# Patient Record
Sex: Female | Born: 1940 | Race: White | Hispanic: No | Marital: Married | Smoking: Former smoker
Health system: Southern US, Community
[De-identification: ages and names within clinical notes are randomized; demographics above are authoritative.]

## PROBLEM LIST (undated history)

## (undated) DIAGNOSIS — I8390 Asymptomatic varicose veins of unspecified lower extremity: Secondary | ICD-10-CM

## (undated) DIAGNOSIS — E039 Hypothyroidism, unspecified: Secondary | ICD-10-CM

## (undated) DIAGNOSIS — F419 Anxiety disorder, unspecified: Secondary | ICD-10-CM

## (undated) DIAGNOSIS — E079 Disorder of thyroid, unspecified: Secondary | ICD-10-CM

## (undated) DIAGNOSIS — I10 Essential (primary) hypertension: Secondary | ICD-10-CM

## (undated) DIAGNOSIS — Z8489 Family history of other specified conditions: Secondary | ICD-10-CM

## (undated) HISTORY — DX: Essential (primary) hypertension: I10

## (undated) HISTORY — DX: Asymptomatic varicose veins of unspecified lower extremity: I83.90

## (undated) HISTORY — DX: Disorder of thyroid, unspecified: E07.9

## (undated) HISTORY — PX: OTHER SURGICAL HISTORY: SHX169

---

## 1998-05-27 ENCOUNTER — Other Ambulatory Visit: Admission: RE | Admit: 1998-05-27 | Discharge: 1998-05-27 | Payer: Self-pay | Admitting: Family Medicine

## 1998-12-28 ENCOUNTER — Encounter: Admission: RE | Admit: 1998-12-28 | Discharge: 1998-12-28 | Payer: Self-pay | Admitting: Family Medicine

## 1998-12-28 ENCOUNTER — Encounter: Payer: Self-pay | Admitting: Family Medicine

## 2000-01-03 ENCOUNTER — Encounter: Admission: RE | Admit: 2000-01-03 | Discharge: 2000-01-03 | Payer: Self-pay | Admitting: Family Medicine

## 2000-01-03 ENCOUNTER — Encounter: Payer: Self-pay | Admitting: Family Medicine

## 2002-06-25 ENCOUNTER — Encounter: Payer: Self-pay | Admitting: Family Medicine

## 2002-06-25 ENCOUNTER — Encounter: Admission: RE | Admit: 2002-06-25 | Discharge: 2002-06-25 | Payer: Self-pay | Admitting: Family Medicine

## 2002-11-06 ENCOUNTER — Other Ambulatory Visit: Admission: RE | Admit: 2002-11-06 | Discharge: 2002-11-06 | Payer: Self-pay | Admitting: Family Medicine

## 2003-01-05 ENCOUNTER — Encounter: Admission: RE | Admit: 2003-01-05 | Discharge: 2003-01-05 | Payer: Self-pay | Admitting: Family Medicine

## 2004-05-04 ENCOUNTER — Ambulatory Visit: Payer: Self-pay | Admitting: Family Medicine

## 2004-06-20 ENCOUNTER — Ambulatory Visit: Payer: Self-pay | Admitting: Family Medicine

## 2004-06-27 ENCOUNTER — Ambulatory Visit: Payer: Self-pay | Admitting: Family Medicine

## 2004-06-27 ENCOUNTER — Other Ambulatory Visit: Admission: RE | Admit: 2004-06-27 | Discharge: 2004-06-27 | Payer: Self-pay | Admitting: Family Medicine

## 2004-12-28 ENCOUNTER — Ambulatory Visit: Payer: Self-pay | Admitting: Family Medicine

## 2005-01-04 ENCOUNTER — Ambulatory Visit (HOSPITAL_COMMUNITY): Admission: RE | Admit: 2005-01-04 | Discharge: 2005-01-04 | Payer: Self-pay | Admitting: Family Medicine

## 2005-04-05 ENCOUNTER — Ambulatory Visit: Payer: Self-pay | Admitting: Family Medicine

## 2005-06-18 ENCOUNTER — Encounter: Admission: RE | Admit: 2005-06-18 | Discharge: 2005-06-18 | Payer: Self-pay | Admitting: Family Medicine

## 2005-06-18 ENCOUNTER — Ambulatory Visit: Payer: Self-pay | Admitting: Family Medicine

## 2005-06-27 ENCOUNTER — Ambulatory Visit: Payer: Self-pay | Admitting: Family Medicine

## 2005-06-27 ENCOUNTER — Other Ambulatory Visit: Admission: RE | Admit: 2005-06-27 | Discharge: 2005-06-27 | Payer: Self-pay | Admitting: Family Medicine

## 2005-06-27 ENCOUNTER — Encounter: Admission: RE | Admit: 2005-06-27 | Discharge: 2005-06-27 | Payer: Self-pay | Admitting: Family Medicine

## 2005-06-28 ENCOUNTER — Ambulatory Visit: Payer: Self-pay | Admitting: Family Medicine

## 2005-07-03 ENCOUNTER — Ambulatory Visit: Payer: Self-pay | Admitting: Family Medicine

## 2005-07-05 ENCOUNTER — Ambulatory Visit: Payer: Self-pay | Admitting: Family Medicine

## 2005-07-23 ENCOUNTER — Ambulatory Visit: Payer: Self-pay | Admitting: Family Medicine

## 2005-07-24 ENCOUNTER — Encounter: Admission: RE | Admit: 2005-07-24 | Discharge: 2005-07-24 | Payer: Self-pay | Admitting: Family Medicine

## 2005-08-06 ENCOUNTER — Encounter: Admission: RE | Admit: 2005-08-06 | Discharge: 2005-08-06 | Payer: Self-pay | Admitting: Orthopaedic Surgery

## 2006-05-14 ENCOUNTER — Encounter: Admission: RE | Admit: 2006-05-14 | Discharge: 2006-05-14 | Payer: Self-pay | Admitting: Internal Medicine

## 2007-10-13 ENCOUNTER — Encounter: Admission: RE | Admit: 2007-10-13 | Discharge: 2007-10-13 | Payer: Self-pay | Admitting: Internal Medicine

## 2008-02-13 ENCOUNTER — Encounter: Admission: RE | Admit: 2008-02-13 | Discharge: 2008-02-13 | Payer: Self-pay | Admitting: Internal Medicine

## 2008-11-16 ENCOUNTER — Encounter: Admission: RE | Admit: 2008-11-16 | Discharge: 2008-11-16 | Payer: Self-pay | Admitting: Internal Medicine

## 2010-05-22 ENCOUNTER — Telehealth: Payer: Self-pay | Admitting: Family Medicine

## 2010-05-22 ENCOUNTER — Other Ambulatory Visit: Payer: Self-pay | Admitting: Internal Medicine

## 2010-05-22 DIAGNOSIS — Z1231 Encounter for screening mammogram for malignant neoplasm of breast: Secondary | ICD-10-CM

## 2010-05-22 NOTE — Telephone Encounter (Signed)
patient  Will call [facility]

## 2010-05-22 NOTE — Telephone Encounter (Signed)
Pt called and needed to know that date,doctor, location, of last colonoscopy. Pls advise.

## 2010-05-24 ENCOUNTER — Ambulatory Visit
Admission: RE | Admit: 2010-05-24 | Discharge: 2010-05-24 | Disposition: A | Discharge status: Home / Self Care | Payer: Medicare Other | Source: Ambulatory Visit | Attending: Internal Medicine | Admitting: Internal Medicine

## 2010-05-24 DIAGNOSIS — Z1231 Encounter for screening mammogram for malignant neoplasm of breast: Secondary | ICD-10-CM

## 2011-02-20 ENCOUNTER — Other Ambulatory Visit: Payer: Self-pay | Admitting: Dermatology

## 2011-05-29 ENCOUNTER — Other Ambulatory Visit: Payer: Self-pay | Admitting: Internal Medicine

## 2011-05-29 DIAGNOSIS — Z1231 Encounter for screening mammogram for malignant neoplasm of breast: Secondary | ICD-10-CM

## 2011-06-05 ENCOUNTER — Ambulatory Visit
Admission: RE | Admit: 2011-06-05 | Discharge: 2011-06-05 | Disposition: A | Discharge status: Home / Self Care | Payer: PRIVATE HEALTH INSURANCE | Source: Ambulatory Visit | Attending: Internal Medicine | Admitting: Internal Medicine

## 2011-06-05 DIAGNOSIS — Z1231 Encounter for screening mammogram for malignant neoplasm of breast: Secondary | ICD-10-CM

## 2011-08-03 ENCOUNTER — Encounter: Payer: Self-pay | Admitting: Vascular Surgery

## 2011-08-06 ENCOUNTER — Ambulatory Visit (INDEPENDENT_AMBULATORY_CARE_PROVIDER_SITE_OTHER): Payer: Medicare Other | Admitting: Vascular Surgery

## 2011-08-06 ENCOUNTER — Encounter: Payer: Self-pay | Admitting: Vascular Surgery

## 2011-08-06 ENCOUNTER — Ambulatory Visit (INDEPENDENT_AMBULATORY_CARE_PROVIDER_SITE_OTHER): Payer: Medicare Other | Admitting: *Deleted

## 2011-08-06 VITALS — BP 132/80 | HR 73 | Resp 16 | Ht 67.0 in | Wt 208.0 lb

## 2011-08-06 DIAGNOSIS — R609 Edema, unspecified: Secondary | ICD-10-CM

## 2011-08-06 DIAGNOSIS — I83893 Varicose veins of bilateral lower extremities with other complications: Secondary | ICD-10-CM

## 2011-08-06 NOTE — Progress Notes (Signed)
Subjective:     Patient ID: Terri Vasquez, female   DOB: 12/18/1940, 71 y.o.   MRN: [id number]  HPI this 71 year old female was referred for spider veins in both lower extremities. She denies any history of DVT, thrombophlebitis, stasis ulcers, bleeding, or distal edema. She has no pain in her legs. She has had sclerotherapy in the past at [facility]  dermatology a few years ago with good results. It is not wear elastic compression stockings. Her right leg is more involved than the left.  Past Medical History  Diagnosis Date  . Varicose veins     History  Substance Use Topics  . Smoking status: Former Games developer  . Smokeless tobacco: Not on file  . Alcohol Use: No    Family History  Problem Relation Age of Onset  . Cancer Mother   . Heart disease Mother   . Hyperlipidemia Mother   . Hypertension Mother     Allergies no known allergies  Current outpatient prescriptions:alendronate (FOSAMAX) 70 MG tablet, , Disp: , Rfl: ;  aspirin 81 MG tablet, Take 81 mg by mouth daily., Disp: , Rfl: ;  atorvastatin (LIPITOR) 10 MG tablet, , Disp: , Rfl: ;  fluocinonide cream (LIDEX) 0.05 %, , Disp: , Rfl: ;  hydrochlorothiazide (HYDRODIURIL) 25 MG tablet, , Disp: , Rfl: ;  levothyroxine (SYNTHROID, LEVOTHROID) 125 MCG tablet, , Disp: , Rfl:   BP 132/80  Pulse 73  Resp 16  Ht 5\' 7"  (1.702 m)  Wt 208 lb (94.348 kg)  BMI 32.58 kg/m2  Body mass index is 32.58 kg/(m^2).        Review of Systems history of right Achilles tendon repair, denies chest pain, dyspnea on exertion, PND, orthopnea, claudication or other systems are negative for complete review of systems    Objective:   Physical Exam blood pressure 1:30 rate heart rate 73 respirations 16 Gen.-alert and oriented x3 in no apparent distress HEENT normal for age Lungs no rhonchi or wheezing Cardiovascular regular rhythm no murmurs carotid pulses 3+ palpable no bruits audible Abdomen soft nontender no palpable masses Musculoskeletal free  of  major deformities Skin clear -no rashes Neurologic normal Lower extremities 3+ femoral and dorsalis pedis pulses palpable bilaterally with no edema she has diffuse spider veins in both lower extremities and anterior and medial thighs in the medial calf and pretibial regions. There is no distal edema and no hyperpigmentation or ulceration is noted. No bulging varicosities are present.  Today I ordered bilateral venous duplex exam which I reviewed and interpreted. She has no DVT. She has mild reflux in the deep system. There is some reflux in the proximal great saphenous vein in the saphenofemoral junction but the veins rapidly exited the fascia.       Assessment:     Bilateral diffuse spider veins-asymptomatic No evidence of severe venous insufficiency    Plan:     Discuss possible sclerotherapy with patient and she will consider this as an option

## 2011-08-09 NOTE — Procedures (Unsigned)
LOWER EXTREMITY VENOUS REFLUX EXAM  INDICATION:  Edema  EXAM:  Using color-flow imaging and pulse Doppler spectral analysis, the bilateral common femoral, femoral, popliteal, posterior tibial, great and small saphenous veins were evaluated.  There is evidence suggesting deep venous insufficiency in the bilateral lower extremity left greater than right.  The bilateral saphenofemoral junction is not competent with reflux of >500 milliseconds.  The bilateral GSV is not competent with reflux of >500 milliseconds with the caliber as described below, however greater saphenous exits fascia in the mid thigh area bilaterally.  The bilateral small saphenous veins demonstrate competency.  GSV Diameter (used if found to be incompetent only)                                           Right    Left Proximal Greater Saphenous Vein           0.39 cm  0.43 Proximal-to-mid-thigh                     0.39 cm  0.39 (out of fascia) Mid thigh                                 0.39 (out of fascia)    0.53 (out of fascia) Mid-distal thigh Distal thigh Knee  IMPRESSION: 1. The bilateral greater saphenous vein is not competent with reflux     of >500 milliseconds. 2. The bilateral greater saphenous vein is not tortuous however leaves     the fascia in the proximal to mid portion of the thigh. 3. The deep venous system is not competent with reflux of >500     milliseconds left greater than right. 4. The bilateral small saphenous vein is competent. 5. No evidence of acute deep venous thrombosis bilaterally.      ___________________________________________ Quita Skye Hart [place], M.D.  SS/MEDQ  D:  08/06/2011  T:  08/06/2011  Job:  [id number]

## 2011-08-14 ENCOUNTER — Encounter: Payer: PRIVATE HEALTH INSURANCE | Admitting: Vascular Surgery

## 2012-09-25 ENCOUNTER — Ambulatory Visit
Admission: RE | Admit: 2012-09-25 | Discharge: 2012-09-25 | Disposition: A | Discharge status: Home / Self Care | Payer: Medicare Other | Source: Ambulatory Visit | Attending: Internal Medicine | Admitting: Internal Medicine

## 2012-09-25 ENCOUNTER — Other Ambulatory Visit: Payer: Self-pay | Admitting: Internal Medicine

## 2012-09-25 DIAGNOSIS — R51 Headache: Secondary | ICD-10-CM

## 2013-04-14 ENCOUNTER — Other Ambulatory Visit: Payer: Self-pay | Admitting: Internal Medicine

## 2013-04-14 ENCOUNTER — Ambulatory Visit
Admission: RE | Admit: 2013-04-14 | Discharge: 2013-04-14 | Disposition: A | Discharge status: Home / Self Care | Payer: Medicare Other | Source: Ambulatory Visit | Attending: Internal Medicine | Admitting: Internal Medicine

## 2013-04-14 DIAGNOSIS — M25539 Pain in unspecified wrist: Secondary | ICD-10-CM

## 2013-06-16 ENCOUNTER — Encounter: Payer: Self-pay | Admitting: Gastroenterology

## 2013-06-16 ENCOUNTER — Other Ambulatory Visit: Payer: Self-pay | Admitting: Internal Medicine

## 2013-06-16 DIAGNOSIS — Z1231 Encounter for screening mammogram for malignant neoplasm of breast: Secondary | ICD-10-CM

## 2013-06-22 ENCOUNTER — Ambulatory Visit
Admission: RE | Admit: 2013-06-22 | Discharge: 2013-06-22 | Disposition: A | Discharge status: Home / Self Care | Payer: Medicare Other | Source: Ambulatory Visit | Attending: Internal Medicine | Admitting: Internal Medicine

## 2013-06-22 DIAGNOSIS — Z1231 Encounter for screening mammogram for malignant neoplasm of breast: Secondary | ICD-10-CM

## 2013-07-14 ENCOUNTER — Ambulatory Visit (AMBULATORY_SURGERY_CENTER): Payer: Self-pay

## 2013-07-14 VITALS — Ht 67.0 in | Wt 204.6 lb

## 2013-07-14 DIAGNOSIS — Z1211 Encounter for screening for malignant neoplasm of colon: Secondary | ICD-10-CM

## 2013-07-14 MED ORDER — NA SULFATE-K SULFATE-MG SULF 17.5-3.13-1.6 GM/177ML PO SOLN: ORAL | Status: DC

## 2013-07-14 NOTE — Progress Notes (Signed)
Per pt, no allergies to soy or egg products.Pt not taking any weight loss meds or using  O2 at home. 

## 2013-07-21 ENCOUNTER — Encounter: Payer: Self-pay | Admitting: Gastroenterology

## 2013-07-28 ENCOUNTER — Encounter: Payer: Medicare Other | Admitting: Gastroenterology

## 2013-09-22 ENCOUNTER — Telehealth: Payer: Self-pay | Admitting: Gastroenterology

## 2013-09-22 ENCOUNTER — Ambulatory Visit: Payer: Medicare Other | Admitting: Gastroenterology

## 2013-09-22 NOTE — Progress Notes (Signed)
Pt arrived for colonoscopy at 715 and states that she had not drank her 2nd half of prep.  She over slept because "her husband has the alarm and did not wake her". Per Dr. Arlyce DiceKaplan, I spoke with Mrs. Terri Vasquez and advised her that he would like for her to reschedule.  She does not want to reschedule at this time.  She will call back when she is ready.

## 2014-02-09 ENCOUNTER — Other Ambulatory Visit: Payer: Self-pay | Admitting: Dermatology

## 2015-07-14 ENCOUNTER — Encounter: Payer: Self-pay | Admitting: Gastroenterology

## 2015-09-15 ENCOUNTER — Encounter: Payer: Self-pay | Admitting: Gastroenterology

## 2015-10-06 ENCOUNTER — Encounter: Payer: Self-pay | Admitting: Internal Medicine

## 2016-04-10 ENCOUNTER — Other Ambulatory Visit: Payer: Self-pay | Admitting: Internal Medicine

## 2016-04-10 ENCOUNTER — Ambulatory Visit
Admission: RE | Admit: 2016-04-10 | Discharge: 2016-04-10 | Disposition: A | Discharge status: Home / Self Care | Payer: Medicare Other | Source: Ambulatory Visit | Attending: Internal Medicine | Admitting: Internal Medicine

## 2016-04-10 DIAGNOSIS — M79601 Pain in right arm: Secondary | ICD-10-CM

## 2016-09-22 ENCOUNTER — Emergency Department (HOSPITAL_COMMUNITY)
Admission: EM | Admit: 2016-09-22 | Discharge: 2016-09-22 | Disposition: A | Discharge status: Home / Self Care | Payer: Medicare Other | Attending: Emergency Medicine | Admitting: Emergency Medicine

## 2016-09-22 ENCOUNTER — Encounter (HOSPITAL_COMMUNITY): Payer: Self-pay | Admitting: *Deleted

## 2016-09-22 ENCOUNTER — Emergency Department (HOSPITAL_COMMUNITY): Payer: Medicare Other

## 2016-09-22 DIAGNOSIS — R4689 Other symptoms and signs involving appearance and behavior: Secondary | ICD-10-CM | POA: Insufficient documentation

## 2016-09-22 DIAGNOSIS — Z79899 Other long term (current) drug therapy: Secondary | ICD-10-CM | POA: Insufficient documentation

## 2016-09-22 DIAGNOSIS — I10 Essential (primary) hypertension: Secondary | ICD-10-CM | POA: Diagnosis not present

## 2016-09-22 DIAGNOSIS — R413 Other amnesia: Secondary | ICD-10-CM | POA: Diagnosis present

## 2016-09-22 DIAGNOSIS — Z7982 Long term (current) use of aspirin: Secondary | ICD-10-CM | POA: Insufficient documentation

## 2016-09-22 DIAGNOSIS — Z87891 Personal history of nicotine dependence: Secondary | ICD-10-CM | POA: Diagnosis not present

## 2016-09-22 LAB — URINALYSIS, ROUTINE W REFLEX MICROSCOPIC
BACTERIA UA: NONE SEEN
Bilirubin Urine: NEGATIVE
Glucose, UA: NEGATIVE mg/dL
Hgb urine dipstick: NEGATIVE
KETONES UR: NEGATIVE mg/dL
Nitrite: NEGATIVE
PROTEIN: NEGATIVE mg/dL
SQUAMOUS EPITHELIAL / LPF: NONE SEEN
Specific Gravity, Urine: 1.003 — ABNORMAL LOW (ref 1.005–1.030)
pH: 7 (ref 5.0–8.0)

## 2016-09-22 LAB — CBC
HEMATOCRIT: 33.7 % — AB (ref 36.0–46.0)
Hemoglobin: 11.1 g/dL — ABNORMAL LOW (ref 12.0–15.0)
MCH: 28.7 pg (ref 26.0–34.0)
MCHC: 32.9 g/dL (ref 30.0–36.0)
MCV: 87.1 fL (ref 78.0–100.0)
PLATELETS: 334 10*3/uL (ref 150–400)
RBC: 3.87 MIL/uL (ref 3.87–5.11)
RDW: 13.5 % (ref 11.5–15.5)
WBC: 5.1 10*3/uL (ref 4.0–10.5)

## 2016-09-22 LAB — COMPREHENSIVE METABOLIC PANEL
ALT: 12 U/L — AB (ref 14–54)
AST: 18 U/L (ref 15–41)
Albumin: 3.5 g/dL (ref 3.5–5.0)
Alkaline Phosphatase: 69 U/L (ref 38–126)
Anion gap: 10 (ref 5–15)
BUN: 8 mg/dL (ref 6–20)
CHLORIDE: 101 mmol/L (ref 101–111)
CO2: 22 mmol/L (ref 22–32)
CREATININE: 1 mg/dL (ref 0.44–1.00)
Calcium: 8.8 mg/dL — ABNORMAL LOW (ref 8.9–10.3)
GFR calc non Af Amer: 54 mL/min — ABNORMAL LOW (ref >60)
Glucose, Bld: 97 mg/dL (ref 65–99)
Potassium: 3.9 mmol/L (ref 3.5–5.1)
SODIUM: 133 mmol/L — AB (ref 135–145)
Total Bilirubin: 1.1 mg/dL (ref 0.3–1.2)
Total Protein: 7 g/dL (ref 6.5–8.1)

## 2016-09-22 NOTE — Discharge Instructions (Signed)
Follow-up with your primary doctor and social work on Monday.

## 2016-09-22 NOTE — ED Triage Notes (Signed)
The pt is here with her huisband  Both are experiencing memory loss.  The son with her   Reports that  His father he thinks has dementia but has not been diagnosed.  They have noticed his mothers memory slippiong a little.  Today the bank called him telling him that his mother had been there requesting to withdraw all her money from them..  The pt does not remember the incident  Alert  She does not know the day of the week she does not know the month the year or who the president is  Equal grips no gait disturbances  No facial droop no drifts

## 2016-09-22 NOTE — ED Provider Notes (Signed)
[facility]-EMERGENCY DEPT Provider Note   CSN: [id number] Arrival date & time: 09/22/16  1804     History   Chief Complaint Chief Complaint  Patient presents with  . Memory Loss    HPI URIEL HORKEY is a 76 y.o. female.  Patient presents with son for workup of memory loss. They've noticed worsening over the past few weeks. Their father they think has dementia as he has had memory loss symptoms for months. She currently lives with her husband in the same house they've been in for years. Today the bank called as she tried to withdrawal all her money. No concerning headaches or other neurologic symptoms. No fevers or chills. No urinary symptoms. Husband is also in the ER getting assessed. No concern for gas leak or other chemicals or new medications with either husband or wife.      Past Medical History:  Diagnosis Date  . Hypertension   . Thyroid disease   . Varicose veins     Patient Active Problem List   Diagnosis Date Noted  . 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 foot surgeries     right foot on achilles tendon    OB History    No data available       Home Medications    Prior to Admission medications   Medication Sig Start Date End Date Taking? Authorizing Provider  acetaminophen (TYLENOL) 500 MG tablet Take 500 mg by mouth every 6 (six) hours as needed for headache (pain).   Yes [provider]  aspirin EC 81 MG tablet Take 81 mg by mouth daily.   Yes [provider]  atorvastatin (LIPITOR) 10 MG tablet Take 10 mg by mouth daily.  05/20/11  Yes [provider]  levothyroxine (SYNTHROID, LEVOTHROID) 125 MCG tablet Take 125 mcg by mouth daily before breakfast.  05/16/11  Yes [provider]    Family History Family History  Problem Relation Age of Onset  . Cancer Mother   . Heart disease Mother   . Hyperlipidemia Mother   . Hypertension  Mother     Social History Social History  Substance Use Topics  . Smoking status: Former Smoker    Types: Cigarettes    Quit date: 07/14/1996  . Smokeless tobacco: Never Used  . Alcohol use No     Allergies   Patient has no known allergies.   Review of Systems Review of Systems  Constitutional: Negative for chills and fever.  HENT: Negative for congestion.   Eyes: Negative for visual disturbance.  Respiratory: Negative for shortness of breath.   Cardiovascular: Negative for chest pain.  Gastrointestinal: Negative for abdominal pain and vomiting.  Genitourinary: Negative for dysuria and flank pain.  Musculoskeletal: Negative for back pain, neck pain and neck stiffness.  Skin: Negative for rash.  Neurological: Negative for light-headedness and headaches.     Physical Exam Updated Vital Signs BP (!) 145/81   Pulse (!) 52   Temp 98 F (36.7 C) (Oral)   Resp 15   Ht 5\' 5"  (1.651 m)   Wt 64.4 kg (142 lb)   SpO2 97%   BMI 23.63 kg/m   Physical Exam  Constitutional: She is oriented to person, place, and time. She appears well-developed and well-nourished.  HENT:  Head: Normocephalic and atraumatic.  Eyes: Conjunctivae are normal. Right eye exhibits no discharge. Left eye exhibits no discharge.  Neck: Normal range  of motion. Neck supple. No tracheal deviation present.  Cardiovascular: Normal rate and regular rhythm.   Pulmonary/Chest: Effort normal and breath sounds normal.  Abdominal: Soft. She exhibits no distension. There is no tenderness. There is no guarding.  Musculoskeletal: She exhibits no edema.  Neurological: She is alert and oriented to person, place, and time.  Patient alert and answers most questions. Patient does avoid detailed questions. Patient does not know month or date. Patient does know her house, family. Patient has no focal neuro deficits. Patient equal strength upper lower extremities with finger nose intact. X Acoma muscle function intact pupils  equal. Neck supple. Memory difficulty.  Skin: Skin is warm. No rash noted.  Psychiatric: She has a normal mood and affect.  Nursing note and vitals reviewed.    ED Treatments / Results  Labs (all labs ordered are listed, but only abnormal results are displayed) Labs Reviewed  COMPREHENSIVE METABOLIC PANEL - Abnormal; Notable for the following:       Result Value   Sodium 133 (*)    Calcium 8.8 (*)    ALT 12 (*)    GFR calc non Af Amer 54 (*)    All other components within normal limits  CBC - Abnormal; Notable for the following:    Hemoglobin 11.1 (*)    HCT 33.7 (*)    All other components within normal limits  URINALYSIS, ROUTINE W REFLEX MICROSCOPIC - Abnormal; Notable for the following:    Color, Urine STRAW (*)    Specific Gravity, Urine 1.003 (*)    Leukocytes, UA MODERATE (*)    All other components within normal limits    EKG  EKG Interpretation None       Radiology Ct Head Wo Contrast  Result Date: 09/22/2016 CLINICAL DATA:  Memory loss. EXAM: CT HEAD WITHOUT CONTRAST TECHNIQUE: Contiguous axial images were obtained from the base of the skull through the vertex without intravenous contrast. COMPARISON:  09/25/2012 FINDINGS: Brain: There is no evidence for acute hemorrhage, hydrocephalus, mass lesion, or abnormal extra-axial fluid collection. No definite CT evidence for acute infarction. Diffuse loss of parenchymal volume is consistent with atrophy. Patchy low attenuation in the deep hemispheric and periventricular white matter is nonspecific, but likely reflects chronic microvascular ischemic demyelination. Mild prominence of the lateral ventricles likely related to central atrophy but a degree of hydrocephalus cannot be completely excluded. Vascular: No hyperdense vessel or unexpected calcification. Skull: No evidence for fracture. No worrisome lytic or sclerotic lesion. Sinuses/Orbits: The visualized paranasal sinuses and mastoid air cells are clear. Visualized  portions of the globes and intraorbital fat are unremarkable. Other: None. IMPRESSION: 1. No evidence for acute hemorrhage or midline shift. No findings for acute infarct. Ventricular prominence is slightly out of proportion to the degree of parenchymal volume loss and may be related to central atrophy although a component of underlying hydrocephalus cannot be excluded by CT. 2. Atrophy with chronic small vessel white matter ischemic disease. Electronically Signed   By: Kennith CenterEric  Mansell M.D.   On: 09/22/2016 20:49    Procedures Procedures (including critical care time)  Medications Ordered in ED Medications - No data to display   Initial Impression / Assessment and Plan / ED Course  I have reviewed the triage vital signs and the nursing notes.  Pertinent labs & imaging results that were available during my care of the patient were reviewed by me and considered in my medical decision making (see chart for details).     Patient presents  with worsening memory loss and change in behavior with trying to take out all her money from her bank. Patient's sons are with her in the ER. Plan for social work consult after blood work and CT scan of the head with urinalysis. Discussed it'll have to follow close with primary care and social worker on Monday.  Results and differential diagnosis were discussed with the patient/parent/guardian. Xrays were independently reviewed by myself.  Close follow up outpatient was discussed, comfortable with the plan.   Medications - No data to display  Vitals:   09/22/16 2036 09/22/16 2100 09/22/16 2115 09/22/16 2130  BP: (!) 162/81 (!) 179/74  (!) 145/81  Pulse: (!) 59 (!) 55 60 (!) 52  Resp: 15 16 16 15   Temp:      TempSrc:      SpO2: 98% 98% 96% 97%  Weight:      Height:        Final diagnoses:  Memory loss     Final Clinical Impressions(s) / ED Diagnoses   Final diagnoses:  Memory loss    New Prescriptions New Prescriptions   No medications on  file     Blane Ohara, MD 09/22/16 2154

## 2016-09-22 NOTE — ED Notes (Addendum)
Pt arrives with sonafter son was told she had attempted to take all the money nfrom her bank account. Pt denies pain or complaint. Pt is not oriented to place person or situation.

## 2016-09-22 NOTE — ED Notes (Signed)
Patient transported to CT 

## 2016-09-22 NOTE — ED Triage Notes (Signed)
They have c02 detectors that are checked frequently

## 2016-09-26 ENCOUNTER — Ambulatory Visit (INDEPENDENT_AMBULATORY_CARE_PROVIDER_SITE_OTHER): Payer: Medicare Other | Admitting: Neurology

## 2016-09-26 ENCOUNTER — Encounter: Payer: Self-pay | Admitting: Neurology

## 2016-09-26 ENCOUNTER — Other Ambulatory Visit: Payer: Medicare Other

## 2016-09-26 VITALS — BP 140/90 | HR 79 | Ht 67.5 in | Wt 172.3 lb

## 2016-09-26 DIAGNOSIS — R413 Other amnesia: Secondary | ICD-10-CM

## 2016-09-26 NOTE — Progress Notes (Signed)
NEUROLOGY CONSULTATION NOTE  Terri Burorma B Philipp MRN: [id number] DOB: 02-27-1941  Referring provider: Dr. Renford Dillsonald Polite Primary care provider: Dr. Renford Dillsonald Polite  Reason for consult:  Memory loss  Dear Dr Nehemiah SettlePolite:  Thank you for your kind referral of Terri Vasquez for consultation of the above symptoms. Although her history is well known to you, please allow me to reiterate it for the purpose of our medical record. The patient was accompanied to the clinic by her [name], who also provides collateral information. Records and images were personally reviewed where available.  HISTORY OF PRESENT ILLNESS: This is a pleasant 76 year old right-handed woman with a history of hyperlipidemia, hypothyroidism, diet-controlled hypertension, presenting for evaluation of memory. She thinks her memory is pretty good. She feels she has not had any problems. She lives with her husband who is in charge of finances, she feels she is pretty good with keeping up with her medications but states it is not perfect. She denies getting lost driving, no word-finding difficulties. Her son became concerned after an incident last 09/22/16 where family was called by the bank stating Ms. Terri Vasquez had come in wanting to withdraw all the money in her account. They could not get in touch with her, she had forgotten the cell phone her sons had gotten for her. A Silver Alert was issued, then she showed up at home the same time the police came. She was brought to [facility] ER where BP was noted to be elevated, CBC and CMP were unremarkable. She had a urinalysis showing moderate leukocytes, 6-30 WBC, negative nitrite. I personally reviewed head CT without contrast which did not show any acute changes, there was diffuse volume loss and chronic microvascular disease. There was note of mild prominence of lateral ventricles likely related to central atrophy but a degree of hydrocephalus could not be completely excluded. She saw Dr. Nehemiah SettlePolite a few days later,  TSH normal, B12 low normal 274 (ref [phone]). Her son today states that he is unsure if there was more of a misunderstanding at the bank, she states that she asked for $50, he thinks that because she has some word-finding issues, she may have said things in a way that sounded like she wanted all her money. Family started noticing memory changes over the past year. She would not recall a phone conversation they had. They would schedule get togethers but not show up, or they would not be home when family picked them up. Yesterday he called to remind her of today's appointment, after she got off the phone, her other son asked who called and she said she was not talking to anyone. It appears her husband is also having memory issues and her son reports that she has been dealing with him. She used to be great with sending birthday cards, but has slacked off the past 1-2 years. She has a calendar, and would forget an appointment if not on the calendar. He reports it is also "50/50" that she would remember an event if written on the calendar. She has missed 2 appointment with Dr. Nehemiah SettlePolite that her son found out about recently. Her husband generally drives and they have gotten lost, but her son is unsure who is driving those times. He did not know what medications she was taking, but the pharmacist told her son that she was not taking them every time. He denies any personality changes, hallucinations, no paranoia. Her son is concerned about potential environmental causes of the patient and  her husband's symptoms, reporting he has lost weight, she sleeps in a recliner to keep an eye on her husband, who keeps her up at night. Her son is concerned about their diet which is mostly fast food. She had an MMSE at Dr. Idelle CrouchPolite's office yesterday, 12/30, normal a year ago.  She denies any headaches, dizziness, diplopia, dysarthria/dysphagia, neck/back pain, focal numbness/tingling/weakness, bowel/bladder dysfunction, anosmia, or  tremors. No family history of dementia, no head injuries or alcohol use. She denies any urinary frequency or burning. 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   PAST MEDICAL HISTORY: Past Medical History:  Diagnosis Date  . Hypertension   . Thyroid disease   . Varicose veins     PAST SURGICAL HISTORY: Past Surgical History:  Procedure Laterality Date  . foot surgeries     right foot on achilles tendon    MEDICATIONS: Current Outpatient Prescriptions on File Prior to Visit  Medication Sig Dispense Refill  . acetaminophen (TYLENOL) 500 MG tablet Take 500 mg by mouth every 6 (six) hours as needed for headache (pain).    Marland Kitchen. aspirin EC 81 MG tablet Take 81 mg by mouth daily.    Marland Kitchen. atorvastatin (LIPITOR) 10 MG tablet Take 10 mg by mouth daily.     Marland Kitchen. levothyroxine (SYNTHROID, LEVOTHROID) 125 MCG tablet Take 125 mcg by mouth daily before breakfast.      No current facility-administered medications on file prior to visit.     ALLERGIES: No Known Allergies  FAMILY HISTORY: Family History  Problem Relation Age of Onset  . Cancer Mother   . Heart disease Mother   . Hyperlipidemia Mother   . Hypertension Mother     SOCIAL HISTORY: Social History   Social History  . Marital status: Married    Spouse name: N/A  . Number of children: N/A  . Years of education: N/A   Occupational History  . Not on file.   Social History Main Topics  . Smoking status: Former Smoker    Types: Cigarettes    Quit date: 07/14/1996  . Smokeless tobacco: Never Used  . Alcohol use No  . Drug use: No  . Sexual activity: Not on file   Other Topics Concern  . Not on file   Social History Narrative  . No narrative on file    REVIEW OF SYSTEMS: Constitutional: No fevers, chills, or sweats, no generalized fatigue, change in appetite Eyes: No visual changes, double vision, eye pain Ear, nose and throat: No hearing loss, ear pain, nasal congestion, sore throat Cardiovascular: No chest pain,  palpitations Respiratory:  No shortness of breath at rest or with exertion, wheezes GastrointestinaI: No nausea, vomiting, diarrhea, abdominal pain, fecal incontinence Genitourinary:  No dysuria, urinary retention or frequency Musculoskeletal:  No neck pain, back pain Integumentary: No rash, pruritus, skin lesions Neurological: as above Psychiatric: No depression, insomnia, anxiety Endocrine: No palpitations, fatigue, diaphoresis, mood swings, change in appetite, change in weight, increased thirst Hematologic/Lymphatic:  No anemia, purpura, petechiae. Allergic/Immunologic: no itchy/runny eyes, nasal congestion, recent allergic reactions, rashes  PHYSICAL EXAM: Vitals:   09/26/16 1348  BP: 140/90  Pulse: 79   General: No acute distress, she became very anxious and agitated during MOCA testing Head:  Normocephalic/atraumatic Eyes: Fundoscopic exam shows bilateral sharp discs, no vessel changes, exudates, or hemorrhages Neck: supple, no paraspinal tenderness, full range of motion Back: No paraspinal tenderness Heart: regular rate and rhythm Lungs: Clear to auscultation bilaterally. Vascular: No carotid bruits. Skin/Extremities: No rash, no edema Neurological Exam:  Mental status: alert and oriented to person, place, and day of week. Repeatedly states her age is [age]. No dysarthria or aphasia, Fund of knowledge is appropriate.  Recent and remote memory are intact.  Attention and concentration are normal.    Able to name objects and repeat phrases.  Montreal Cognitive Assessment  09/26/2016  Visuospatial/ Executive (0/5) 3  Naming (0/3) 3  Attention: Read list of digits (0/2) 1  Attention: Read list of letters (0/1) 0  Attention: Serial 7 subtraction starting at 100 (0/3) 1  Language: Repeat phrase (0/2) 2  Language : Fluency (0/1) 0  Abstraction (0/2) 1  Delayed Recall (0/5) 0  Orientation (0/6) 3  Total 14   Cranial nerves: CN I: not tested CN II: pupils equal, round and  reactive to light, visual fields intact, fundi unremarkable. CN III, IV, VI:  full range of motion, no nystagmus, no ptosis CN V: facial sensation intact CN VII: upper and lower face symmetric CN VIII: hearing intact to finger rub CN IX, X: gag intact, uvula midline CN XI: sternocleidomastoid and trapezius muscles intact CN XII: tongue midline Bulk & Tone: normal, no fasciculations. Motor: 5/5 throughout with no pronator drift. Sensation: intact to light touch, cold, pin, vibration and joint position sense.  No extinction to double simultaneous stimulation.  Romberg test negative Deep Tendon Reflexes: +2 throughout, no ankle clonus Plantar responses: downgoing bilaterally Cerebellar: no incoordination on finger to nose, heel to shin. No dysdiadochokinesia Gait: narrow-based and steady, able to tandem walk adequately. Tremor: none  IMPRESSION: This is a 76 year old right-handed woman with a history of hyperlipidemia, hypothyroidism, diet-controlled hypertension, presenting for evaluation of memory changes that became concerning for family this week after they got a call from the bank that she was asking to withdraw all her money. She states she only wanted to get $50. Her son is unsure if it was just a misunderstanding with the bank or if she truly had an issue, but has noticed memory changes over the past few months. Her neurological exam is non-focal, MOCA score is 14/30, indicating mild dementia. Her MMSE yesterday was 12/30. She was noted to become very anxious and agitated during memory testing, to the point that she would freeze up. We discussed different causes of memory changes, MRI brain without contrast will be ordered to assess for underlying structural abnormality. Her urinalysis in the ER seemed abnormal, repeat UA will be ordered. We discussed how anxiety can cause memory issues, there appears to be some anxiety with taking care of her husband. Continue to monitor anxiety, we discussed  that treating this could sometimes help with memory issues. We discussed the importance of control of vascular risk factors, physical exercise, and brain stimulation exercises for brain health. A follow-up in 3 months will be scheduled to assess for interval change.   Thank you for allowing me to participate in the care of this patient. Please do not hesitate to call for any questions or concerns.   Patrcia Dolly, M.D.  CC: Dr. Nehemiah Settle

## 2016-09-26 NOTE — Patient Instructions (Addendum)
1. Schedule MRI brain without contrast  We have sent a referral to [facility] for your MRI and they will call you directly to schedule your appt. They are located at [address] If you need to contact them directly please call [phone].   2. Check urinalysis with reflex microscopy  Your provider has requested that you have a urine culture completed today. Please go to [facility] ([id number]) on the second floor of this building before leaving the office today. You do not need to check in. If you are not called within 15 minutes please check with the front desk.   3. Continue to monitor anxiety 4. Bloodwork from Dr. Nehemiah SettlePolite will be requested for review 5. Control of blood pressure, cholesterol, as well as physical exercise and brain stimulation exercises are important for brain health 6. Follow-up in 3 months, call for any changes

## 2016-09-27 LAB — UA/M W/RFLX CULTURE, COMP
BILIRUBIN UA: NEGATIVE
GLUCOSE, UA: NEGATIVE
KETONES UA: NEGATIVE
LEUKOCYTES UA: NEGATIVE
Nitrite, UA: NEGATIVE
RBC, UA: NEGATIVE
SPEC GRAV UA: 1.017 (ref 1.005–1.030)
UUROB: 1 mg/dL (ref 0.2–1.0)
pH, UA: 5.5 (ref 5.0–7.5)

## 2016-09-27 LAB — MICROSCOPIC EXAMINATION
Bacteria, UA: NONE SEEN
Casts: NONE SEEN /lpf

## 2016-09-28 ENCOUNTER — Telehealth: Payer: Self-pay

## 2016-09-28 NOTE — Telephone Encounter (Signed)
Spoke with pt's son, Feliz Beamravis, relaying message below.

## 2016-09-28 NOTE — Telephone Encounter (Signed)
-----   Message from Van ClinesKaren M Aquino, MD sent at 09/27/2016  2:46 PM EDT ----- Pls let son know the urinalysis was normal, no infection seen. Thanks

## 2016-10-01 DIAGNOSIS — R413 Other amnesia: Secondary | ICD-10-CM | POA: Insufficient documentation

## 2016-10-08 ENCOUNTER — Ambulatory Visit
Admission: RE | Admit: 2016-10-08 | Discharge: 2016-10-08 | Disposition: A | Discharge status: Home / Self Care | Payer: Medicare Other | Source: Ambulatory Visit | Attending: Neurology | Admitting: Neurology

## 2016-10-08 DIAGNOSIS — R413 Other amnesia: Secondary | ICD-10-CM

## 2016-10-15 ENCOUNTER — Telehealth: Payer: Self-pay

## 2016-10-15 NOTE — Telephone Encounter (Signed)
Pt's son rtrnd call, advised of MRI results.

## 2016-10-15 NOTE — Telephone Encounter (Signed)
-----   Message from Van ClinesKaren M Aquino, MD sent at 10/09/2016 11:15 AM EDT ----- Pls let her know MRI brain did not show any evidence of tumor, stroke, or bleed. It showed age-related changes. Thanks

## 2016-10-15 NOTE — Telephone Encounter (Signed)
LM on VM for Pt to call me back re: MRI results.

## 2016-10-23 ENCOUNTER — Telehealth: Payer: Self-pay | Admitting: Neurology

## 2016-10-23 NOTE — Telephone Encounter (Signed)
Spoke with pt's son, Feliz Beam, who states that Dr Nehemiah Settle has prescribed the pt Xanax 0.25mg  - 1/2 tab PRN.  He just wants to make sure that Dr. Karel Jarvis is OK with pt taking this.  Please advise.

## 2016-10-23 NOTE — Telephone Encounter (Signed)
Patient son wants to talk to someone about medication

## 2016-10-23 NOTE — Telephone Encounter (Signed)
That is fine, minimize as much as possible. Thanks

## 2016-10-24 NOTE — Telephone Encounter (Signed)
Spoke with travis, relaying message below

## 2016-12-26 ENCOUNTER — Ambulatory Visit: Payer: Medicare Other | Admitting: Neurology

## 2017-08-23 ENCOUNTER — Inpatient Hospital Stay (HOSPITAL_COMMUNITY): Payer: Medicare Other

## 2017-08-23 ENCOUNTER — Emergency Department (HOSPITAL_COMMUNITY): Payer: Medicare Other

## 2017-08-23 ENCOUNTER — Encounter (HOSPITAL_COMMUNITY)
Admission: EM | Disposition: A | Discharge status: Skilled Nursing Facility | Payer: Self-pay | Source: Home / Self Care | Attending: Internal Medicine

## 2017-08-23 ENCOUNTER — Emergency Department (HOSPITAL_COMMUNITY): Payer: Medicare Other | Admitting: Anesthesiology

## 2017-08-23 ENCOUNTER — Other Ambulatory Visit: Payer: Self-pay

## 2017-08-23 ENCOUNTER — Encounter (HOSPITAL_COMMUNITY): Payer: Self-pay

## 2017-08-23 ENCOUNTER — Inpatient Hospital Stay (HOSPITAL_COMMUNITY)
Admission: EM | Admit: 2017-08-23 | Discharge: 2017-08-26 | DRG: 481 | Disposition: A | Discharge status: Skilled Nursing Facility | Payer: Medicare Other | Attending: Internal Medicine | Admitting: Internal Medicine

## 2017-08-23 DIAGNOSIS — S72001A Fracture of unspecified part of neck of right femur, initial encounter for closed fracture: Secondary | ICD-10-CM | POA: Diagnosis present

## 2017-08-23 DIAGNOSIS — E876 Hypokalemia: Secondary | ICD-10-CM | POA: Diagnosis present

## 2017-08-23 DIAGNOSIS — Z87891 Personal history of nicotine dependence: Secondary | ICD-10-CM | POA: Diagnosis not present

## 2017-08-23 DIAGNOSIS — Z8249 Family history of ischemic heart disease and other diseases of the circulatory system: Secondary | ICD-10-CM

## 2017-08-23 DIAGNOSIS — Y92039 Unspecified place in apartment as the place of occurrence of the external cause: Secondary | ICD-10-CM | POA: Diagnosis not present

## 2017-08-23 DIAGNOSIS — I10 Essential (primary) hypertension: Secondary | ICD-10-CM | POA: Diagnosis present

## 2017-08-23 DIAGNOSIS — I8390 Asymptomatic varicose veins of unspecified lower extremity: Secondary | ICD-10-CM | POA: Diagnosis present

## 2017-08-23 DIAGNOSIS — R413 Other amnesia: Secondary | ICD-10-CM | POA: Diagnosis present

## 2017-08-23 DIAGNOSIS — K5900 Constipation, unspecified: Secondary | ICD-10-CM | POA: Diagnosis present

## 2017-08-23 DIAGNOSIS — Z809 Family history of malignant neoplasm, unspecified: Secondary | ICD-10-CM | POA: Diagnosis not present

## 2017-08-23 DIAGNOSIS — W010XXA Fall on same level from slipping, tripping and stumbling without subsequent striking against object, initial encounter: Secondary | ICD-10-CM | POA: Diagnosis present

## 2017-08-23 DIAGNOSIS — N179 Acute kidney failure, unspecified: Secondary | ICD-10-CM | POA: Diagnosis present

## 2017-08-23 DIAGNOSIS — I739 Peripheral vascular disease, unspecified: Secondary | ICD-10-CM | POA: Diagnosis present

## 2017-08-23 DIAGNOSIS — Z79890 Hormone replacement therapy (postmenopausal): Secondary | ICD-10-CM

## 2017-08-23 DIAGNOSIS — F0390 Unspecified dementia without behavioral disturbance: Secondary | ICD-10-CM | POA: Diagnosis present

## 2017-08-23 DIAGNOSIS — S72009A Fracture of unspecified part of neck of unspecified femur, initial encounter for closed fracture: Secondary | ICD-10-CM | POA: Insufficient documentation

## 2017-08-23 DIAGNOSIS — E039 Hypothyroidism, unspecified: Secondary | ICD-10-CM | POA: Diagnosis present

## 2017-08-23 DIAGNOSIS — Z8349 Family history of other endocrine, nutritional and metabolic diseases: Secondary | ICD-10-CM

## 2017-08-23 DIAGNOSIS — R4701 Aphasia: Secondary | ICD-10-CM | POA: Diagnosis present

## 2017-08-23 DIAGNOSIS — Z79899 Other long term (current) drug therapy: Secondary | ICD-10-CM | POA: Diagnosis not present

## 2017-08-23 DIAGNOSIS — S72011A Unspecified intracapsular fracture of right femur, initial encounter for closed fracture: Principal | ICD-10-CM | POA: Diagnosis present

## 2017-08-23 DIAGNOSIS — M1990 Unspecified osteoarthritis, unspecified site: Secondary | ICD-10-CM | POA: Diagnosis present

## 2017-08-23 DIAGNOSIS — M25551 Pain in right hip: Secondary | ICD-10-CM

## 2017-08-23 HISTORY — DX: Hypothyroidism, unspecified: E03.9

## 2017-08-23 HISTORY — DX: Family history of other specified conditions: Z84.89

## 2017-08-23 HISTORY — PX: HIP PINNING,CANNULATED: SHX1758

## 2017-08-23 HISTORY — DX: Essential (primary) hypertension: I10

## 2017-08-23 LAB — COMPREHENSIVE METABOLIC PANEL
ALBUMIN: 3.7 g/dL (ref 3.5–5.0)
ALK PHOS: 59 U/L (ref 38–126)
ALT: 24 U/L (ref 14–54)
AST: 28 U/L (ref 15–41)
Anion gap: 10 (ref 5–15)
BILIRUBIN TOTAL: 0.7 mg/dL (ref 0.3–1.2)
BUN: 18 mg/dL (ref 6–20)
CALCIUM: 9.1 mg/dL (ref 8.9–10.3)
CO2: 28 mmol/L (ref 22–32)
CREATININE: 1.09 mg/dL — AB (ref 0.44–1.00)
Chloride: 102 mmol/L (ref 101–111)
GFR calc non Af Amer: 48 mL/min — ABNORMAL LOW (ref >60)
GFR, EST AFRICAN AMERICAN: 56 mL/min — AB (ref >60)
GLUCOSE: 108 mg/dL — AB (ref 65–99)
Potassium: 3.6 mmol/L (ref 3.5–5.1)
SODIUM: 140 mmol/L (ref 135–145)
TOTAL PROTEIN: 7 g/dL (ref 6.5–8.1)

## 2017-08-23 LAB — URINALYSIS, ROUTINE W REFLEX MICROSCOPIC
Bilirubin Urine: NEGATIVE
Glucose, UA: NEGATIVE mg/dL
HGB URINE DIPSTICK: NEGATIVE
Ketones, ur: NEGATIVE mg/dL
Leukocytes, UA: NEGATIVE
Nitrite: NEGATIVE
PH: 7 (ref 5.0–8.0)
Protein, ur: NEGATIVE mg/dL
SPECIFIC GRAVITY, URINE: 1.008 (ref 1.005–1.030)

## 2017-08-23 LAB — CBC WITH DIFFERENTIAL/PLATELET
Basophils Absolute: 0 10*3/uL (ref 0.0–0.1)
Basophils Relative: 0 %
EOS ABS: 0.1 10*3/uL (ref 0.0–0.7)
Eosinophils Relative: 1 %
HEMATOCRIT: 37 % (ref 36.0–46.0)
HEMOGLOBIN: 12.4 g/dL (ref 12.0–15.0)
LYMPHS ABS: 1.2 10*3/uL (ref 0.7–4.0)
LYMPHS PCT: 16 %
MCH: 30.8 pg (ref 26.0–34.0)
MCHC: 33.5 g/dL (ref 30.0–36.0)
MCV: 91.8 fL (ref 78.0–100.0)
Monocytes Absolute: 0.5 10*3/uL (ref 0.1–1.0)
Monocytes Relative: 7 %
NEUTROS ABS: 5.3 10*3/uL (ref 1.7–7.7)
NEUTROS PCT: 76 %
Platelets: 268 10*3/uL (ref 150–400)
RBC: 4.03 MIL/uL (ref 3.87–5.11)
RDW: 13.9 % (ref 11.5–15.5)
WBC: 7.1 10*3/uL (ref 4.0–10.5)

## 2017-08-23 LAB — TYPE AND SCREEN
ABO/RH(D): A POS
Antibody Screen: NEGATIVE

## 2017-08-23 LAB — ABO/RH: ABO/RH(D): A POS

## 2017-08-23 SURGERY — FIXATION, FEMUR, NECK, PERCUTANEOUS, USING SCREW
Anesthesia: Spinal | Site: Hip | Laterality: Right

## 2017-08-23 MED ORDER — SODIUM CHLORIDE 0.9 % IV SOLN
INTRAVENOUS | Status: DC
  Administered 2017-08-23: 23:00:00 via INTRAVENOUS

## 2017-08-23 MED ORDER — PROPOFOL 500 MG/50ML IV EMUL
INTRAVENOUS | Status: DC | PRN
  Administered 2017-08-23: 25 ug/kg/min via INTRAVENOUS

## 2017-08-23 MED ORDER — EPHEDRINE 5 MG/ML INJ
INTRAVENOUS | Status: AC
  Filled 2017-08-23: qty 10

## 2017-08-23 MED ORDER — BISACODYL 5 MG PO TBEC: 5.0000 mg | DELAYED_RELEASE_TABLET | Freq: Every day | ORAL | Status: DC | PRN

## 2017-08-23 MED ORDER — ONDANSETRON HCL 4 MG/2ML IJ SOLN
INTRAMUSCULAR | Status: AC
  Filled 2017-08-23: qty 2

## 2017-08-23 MED ORDER — LIDOCAINE 2% (20 MG/ML) 5 ML SYRINGE
INTRAMUSCULAR | Status: AC
Start: 2017-08-23 — End: ?
  Filled 2017-08-23: qty 5

## 2017-08-23 MED ORDER — LEVOTHYROXINE SODIUM 125 MCG PO TABS
125.0000 ug | ORAL_TABLET | Freq: Every day | ORAL | Status: DC
  Administered 2017-08-24 – 2017-08-26 (×3): 125 ug via ORAL
  Filled 2017-08-23 (×3): qty 1

## 2017-08-23 MED ORDER — POLYETHYLENE GLYCOL 3350 17 G PO PACK: 17.0000 g | PACK | Freq: Every day | ORAL | Status: DC | PRN

## 2017-08-23 MED ORDER — POVIDONE-IODINE 10 % EX SWAB: 2.0000 "application " | Freq: Once | CUTANEOUS | Status: DC

## 2017-08-23 MED ORDER — ACETAMINOPHEN 500 MG PO TABS
500.0000 mg | ORAL_TABLET | Freq: Four times a day (QID) | ORAL | Status: AC
  Administered 2017-08-23 – 2017-08-24 (×4): 500 mg via ORAL
  Filled 2017-08-23 (×4): qty 1

## 2017-08-23 MED ORDER — PROPOFOL 10 MG/ML IV BOLUS
INTRAVENOUS | Status: AC
Start: 2017-08-23 — End: ?
  Filled 2017-08-23: qty 20

## 2017-08-23 MED ORDER — METOCLOPRAMIDE HCL 5 MG PO TABS: 5.0000 mg | ORAL_TABLET | Freq: Three times a day (TID) | ORAL | Status: DC | PRN

## 2017-08-23 MED ORDER — HYDROCHLOROTHIAZIDE 25 MG PO TABS
25.0000 mg | ORAL_TABLET | Freq: Every day | ORAL | Status: DC
  Administered 2017-08-24 – 2017-08-26 (×3): 25 mg via ORAL
  Filled 2017-08-23 (×3): qty 1

## 2017-08-23 MED ORDER — KETAMINE HCL 10 MG/ML IJ SOLN
INTRAMUSCULAR | Status: DC | PRN
  Administered 2017-08-23: 20 mg via INTRAVENOUS
  Administered 2017-08-23: 10 mg via INTRAVENOUS
  Administered 2017-08-23: 20 mg via INTRAVENOUS

## 2017-08-23 MED ORDER — FERROUS SULFATE 325 (65 FE) MG PO TBEC: 325.0000 mg | DELAYED_RELEASE_TABLET | Freq: Two times a day (BID) | ORAL | Status: DC

## 2017-08-23 MED ORDER — DOCUSATE SODIUM 100 MG PO CAPS
100.0000 mg | ORAL_CAPSULE | Freq: Two times a day (BID) | ORAL | Status: DC
  Administered 2017-08-23 – 2017-08-26 (×6): 100 mg via ORAL
  Filled 2017-08-23 (×6): qty 1

## 2017-08-23 MED ORDER — PROPOFOL 10 MG/ML IV BOLUS
INTRAVENOUS | Status: DC | PRN
  Administered 2017-08-23 (×2): 20 mg via INTRAVENOUS

## 2017-08-23 MED ORDER — LIDOCAINE 2% (20 MG/ML) 5 ML SYRINGE
INTRAMUSCULAR | Status: DC | PRN
  Administered 2017-08-23: 50 mg via INTRAVENOUS

## 2017-08-23 MED ORDER — CEFAZOLIN SODIUM-DEXTROSE 2-4 GM/100ML-% IV SOLN: 2.0000 g | Freq: Once | INTRAVENOUS | Status: DC

## 2017-08-23 MED ORDER — CEFAZOLIN SODIUM-DEXTROSE 2-4 GM/100ML-% IV SOLN
2.0000 g | Freq: Four times a day (QID) | INTRAVENOUS | Status: AC
  Administered 2017-08-24 (×2): 2 g via INTRAVENOUS
  Filled 2017-08-23 (×2): qty 100

## 2017-08-23 MED ORDER — DEXAMETHASONE SODIUM PHOSPHATE 10 MG/ML IJ SOLN
INTRAMUSCULAR | Status: AC
  Filled 2017-08-23: qty 1

## 2017-08-23 MED ORDER — FENTANYL CITRATE (PF) 100 MCG/2ML IJ SOLN
INTRAMUSCULAR | Status: AC
  Filled 2017-08-23: qty 2

## 2017-08-23 MED ORDER — METOCLOPRAMIDE HCL 5 MG/ML IJ SOLN: 5.0000 mg | Freq: Three times a day (TID) | INTRAMUSCULAR | Status: DC | PRN

## 2017-08-23 MED ORDER — HYDROCODONE-ACETAMINOPHEN 5-325 MG PO TABS
1.0000 | ORAL_TABLET | ORAL | Status: DC | PRN
  Administered 2017-08-24: 2 via ORAL
  Administered 2017-08-24: 1 via ORAL
  Administered 2017-08-25 (×2): 2 via ORAL
  Filled 2017-08-23 (×4): qty 2
  Filled 2017-08-23: qty 1

## 2017-08-23 MED ORDER — ONDANSETRON HCL 4 MG/2ML IJ SOLN: 4.0000 mg | Freq: Four times a day (QID) | INTRAMUSCULAR | Status: DC | PRN

## 2017-08-23 MED ORDER — BUPIVACAINE IN DEXTROSE 0.75-8.25 % IT SOLN
INTRATHECAL | Status: DC | PRN
  Administered 2017-08-23: 2 mL via INTRATHECAL

## 2017-08-23 MED ORDER — HYDRALAZINE HCL 20 MG/ML IJ SOLN: 10.0000 mg | Freq: Four times a day (QID) | INTRAMUSCULAR | Status: DC | PRN

## 2017-08-23 MED ORDER — CALCIUM CARB-CHOLECALCIFEROL 500-400 MG-UNIT PO CHEW: 1.0000 | CHEWABLE_TABLET | Freq: Every day | ORAL | Status: DC

## 2017-08-23 MED ORDER — ASPIRIN EC 325 MG PO TBEC
325.0000 mg | DELAYED_RELEASE_TABLET | Freq: Every day | ORAL | Status: DC
  Administered 2017-08-24: 325 mg via ORAL
  Filled 2017-08-23: qty 1

## 2017-08-23 MED ORDER — ONDANSETRON HCL 4 MG PO TABS: 4.0000 mg | ORAL_TABLET | Freq: Four times a day (QID) | ORAL | Status: DC | PRN

## 2017-08-23 MED ORDER — CEFAZOLIN SODIUM-DEXTROSE 2-4 GM/100ML-% IV SOLN
2.0000 g | INTRAVENOUS | Status: AC
  Administered 2017-08-23: 2 g via INTRAVENOUS

## 2017-08-23 MED ORDER — CEFAZOLIN SODIUM-DEXTROSE 2-4 GM/100ML-% IV SOLN
INTRAVENOUS | Status: AC
  Filled 2017-08-23: qty 100

## 2017-08-23 MED ORDER — ONDANSETRON HCL 4 MG/2ML IJ SOLN: 4.0000 mg | Freq: Once | INTRAMUSCULAR | Status: DC | PRN

## 2017-08-23 MED ORDER — CALCIUM CARBONATE-VITAMIN D 500-200 MG-UNIT PO TABS
1.0000 | ORAL_TABLET | Freq: Every day | ORAL | Status: DC
  Administered 2017-08-24 – 2017-08-26 (×3): 1 via ORAL
  Filled 2017-08-23 (×3): qty 1

## 2017-08-23 MED ORDER — FENTANYL CITRATE (PF) 100 MCG/2ML IJ SOLN: 25.0000 ug | INTRAMUSCULAR | Status: DC | PRN

## 2017-08-23 MED ORDER — TRAMADOL HCL 50 MG PO TABS: 50.0000 mg | ORAL_TABLET | Freq: Four times a day (QID) | ORAL | 0 refills | Status: DC | PRN

## 2017-08-23 MED ORDER — METHOCARBAMOL 1000 MG/10ML IJ SOLN
500.0000 mg | Freq: Four times a day (QID) | INTRAVENOUS | Status: DC | PRN
  Filled 2017-08-23: qty 5

## 2017-08-23 MED ORDER — METHOCARBAMOL 500 MG PO TABS
500.0000 mg | ORAL_TABLET | Freq: Four times a day (QID) | ORAL | Status: DC | PRN
  Administered 2017-08-24: 500 mg via ORAL
  Filled 2017-08-23: qty 1

## 2017-08-23 MED ORDER — EPHEDRINE SULFATE-NACL 50-0.9 MG/10ML-% IV SOSY
PREFILLED_SYRINGE | INTRAVENOUS | Status: DC | PRN
  Administered 2017-08-23 (×3): 10 mg via INTRAVENOUS

## 2017-08-23 MED ORDER — KETAMINE HCL 10 MG/ML IJ SOLN
INTRAMUSCULAR | Status: AC
  Filled 2017-08-23: qty 1

## 2017-08-23 MED ORDER — FERROUS SULFATE 325 (65 FE) MG PO TABS
325.0000 mg | ORAL_TABLET | Freq: Two times a day (BID) | ORAL | Status: DC
  Administered 2017-08-24 – 2017-08-26 (×5): 325 mg via ORAL
  Filled 2017-08-23 (×5): qty 1

## 2017-08-23 MED ORDER — SODIUM CHLORIDE 0.9 % IV SOLN: INTRAVENOUS | Status: DC

## 2017-08-23 MED ORDER — CHLORHEXIDINE GLUCONATE 4 % EX LIQD: 60.0000 mL | Freq: Once | CUTANEOUS | Status: DC

## 2017-08-23 MED ORDER — HYDROMORPHONE HCL 1 MG/ML IJ SOLN: 0.5000 mg | INTRAMUSCULAR | Status: DC | PRN

## 2017-08-23 MED ORDER — DEXAMETHASONE SODIUM PHOSPHATE 10 MG/ML IJ SOLN
INTRAMUSCULAR | Status: DC | PRN
  Administered 2017-08-23: 4 mg via INTRAVENOUS

## 2017-08-23 MED ORDER — LACTATED RINGERS IV SOLN
INTRAVENOUS | Status: DC | PRN
  Administered 2017-08-23 (×2): via INTRAVENOUS

## 2017-08-23 MED ORDER — LACTATED RINGERS IV SOLN
INTRAVENOUS | Status: DC
  Administered 2017-08-23: 18:00:00 via INTRAVENOUS

## 2017-08-23 MED ORDER — ONDANSETRON HCL 4 MG/2ML IJ SOLN
INTRAMUSCULAR | Status: DC | PRN
  Administered 2017-08-23: 4 mg via INTRAVENOUS

## 2017-08-23 MED ORDER — FENTANYL CITRATE (PF) 250 MCG/5ML IJ SOLN
INTRAMUSCULAR | Status: AC
  Filled 2017-08-23: qty 5

## 2017-08-23 MED ORDER — ACETAMINOPHEN 325 MG PO TABS
650.0000 mg | ORAL_TABLET | Freq: Once | ORAL | Status: AC
  Administered 2017-08-23: 650 mg via ORAL
  Filled 2017-08-23: qty 2

## 2017-08-23 MED ORDER — ALUM & MAG HYDROXIDE-SIMETH 200-200-20 MG/5ML PO SUSP: 30.0000 mL | ORAL | Status: DC | PRN

## 2017-08-23 MED ORDER — ACETAMINOPHEN 325 MG PO TABS: 325.0000 mg | ORAL_TABLET | Freq: Four times a day (QID) | ORAL | Status: DC | PRN

## 2017-08-23 MED ORDER — PROPOFOL 10 MG/ML IV BOLUS
INTRAVENOUS | Status: AC
  Filled 2017-08-23: qty 20

## 2017-08-23 MED ORDER — ATORVASTATIN CALCIUM 10 MG PO TABS
10.0000 mg | ORAL_TABLET | Freq: Every day | ORAL | Status: DC
  Administered 2017-08-24 – 2017-08-26 (×3): 10 mg via ORAL
  Filled 2017-08-23 (×3): qty 1

## 2017-08-23 MED ORDER — ASPIRIN EC 325 MG PO TBEC: 325.0000 mg | DELAYED_RELEASE_TABLET | Freq: Every day | ORAL | 0 refills | Status: DC

## 2017-08-23 SURGICAL SUPPLY — 30 items
BIT DRILL ACE CANN 5.5 (BIT) ×1 IMPLANT
BIT DRILL ACE CANN 5.5MM (BIT) ×1
BNDG GAUZE ELAST 4 BULKY (GAUZE/BANDAGES/DRESSINGS) ×3 IMPLANT
CLOSURE WOUND 1/2 X4 (GAUZE/BANDAGES/DRESSINGS)
DRAPE STERI IOBAN 125X83 (DRAPES) ×3 IMPLANT
DRSG EMULSION OIL 3X16 NADH (GAUZE/BANDAGES/DRESSINGS) ×3 IMPLANT
DRSG MEPILEX BORDER 4X4 (GAUZE/BANDAGES/DRESSINGS) ×2 IMPLANT
DRSG PAD ABDOMINAL 8X10 ST (GAUZE/BANDAGES/DRESSINGS) ×1 IMPLANT
DURAPREP 26ML APPLICATOR (WOUND CARE) ×3 IMPLANT
ELECT REM PT RETURN 15FT ADLT (MISCELLANEOUS) ×3 IMPLANT
GAUZE SPONGE 2X2 8PLY STRL LF (GAUZE/BANDAGES/DRESSINGS) IMPLANT
GAUZE SPONGE 4X4 12PLY STRL (GAUZE/BANDAGES/DRESSINGS) ×1 IMPLANT
GAUZE XEROFORM 1X8 LF (GAUZE/BANDAGES/DRESSINGS) ×2 IMPLANT
GLOVE BIO SURGEON STRL SZ8 (GLOVE) ×3 IMPLANT
GLOVE ECLIPSE 7.5 STRL STRAW (GLOVE) ×3 IMPLANT
MANIFOLD NEPTUNE II (INSTRUMENTS) ×3 IMPLANT
PACK GENERAL/GYN (CUSTOM PROCEDURE TRAY) ×3 IMPLANT
PIN THREADED GUIDE ACE (PIN) ×6 IMPLANT
POSITIONER SURGICAL ARM (MISCELLANEOUS) ×3 IMPLANT
SCREW CANN 8.0 80MM (Screw) ×2 IMPLANT
SCREW CANN 8.0 90MM (Screw) ×2 IMPLANT
SCREW CANNULATED 8.0MMX100 (Screw) ×2 IMPLANT
SPONGE GAUZE 2X2 STER 10/PKG (GAUZE/BANDAGES/DRESSINGS) ×2
STAPLER VISISTAT 35W (STAPLE) ×2 IMPLANT
STRIP CLOSURE SKIN 1/2X4 (GAUZE/BANDAGES/DRESSINGS) ×1 IMPLANT
SUT MNCRL AB 3-0 PS2 18 (SUTURE) ×3 IMPLANT
SUT VIC AB 1 CT1 36 (SUTURE) ×4 IMPLANT
SUT VIC AB 2-0 CT1 27 (SUTURE) ×3
SUT VIC AB 2-0 CT1 TAPERPNT 27 (SUTURE) ×2 IMPLANT
TRAY FOLEY CATH 14FRSI W/METER (CATHETERS) ×2 IMPLANT

## 2017-08-23 NOTE — ED Provider Notes (Signed)
[facility]-EMERGENCY DEPT Provider Note   CSN: [id number] Arrival date & time: 08/23/17  1517     History   Chief Complaint Chief Complaint  Patient presents with  . Fall    HPI Terri Vasquez is a 77 y.o. female.  The history is provided by the patient, the EMS personnel and the nursing home. No language interpreter was used.  Fall     Terri Vasquez is a 77 y.o. female who presents to the Emergency Department complaining of fall.  Level V caveat due to dementia. She presents from [facility] nursing facility for evaluation of injuries following a fall. History is limited due to patient's dementia. Per report and patient history she was walking in her apartment when she slipped on the carpeting and fell forward. At the time of fall she reported pain to her right hip and did not want to move the hip. She did ambulate to the EMS stretcher. On ED arrival she denies any complaints of pain. No report of head injury or loss of consciousness. She takes no anticoagulants. She resides in her apartment with her husband, who also suffers from dementia.  Past Medical History:  Diagnosis Date  . Family history of adverse reaction to anesthesia    sons have difficulty waking up  . Hypertension   . Hypothyroidism   . Thyroid disease   . Varicose veins     Patient Active Problem List   Diagnosis Date Noted  . Hip fracture (HCC) 08/23/2017  . Fracture of femoral neck, right (HCC) 08/23/2017  . Memory loss 10/01/2016  . 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 foot surgeries     right foot on achilles tendon  . NO PAST SURGERIES       OB History   None      Home Medications    Prior to Admission medications   Medication Sig Start Date End Date Taking? Authorizing Provider  atorvastatin (LIPITOR) 10 MG tablet Take 10 mg by mouth daily.  05/20/11  Yes [provider]  Calcium Carb-Cholecalciferol (CALCIUM 500/D) 500-400 MG-UNIT CHEW Chew 1 tablet by mouth daily.   Yes [provider]  ferrous sulfate 325 (65 FE) MG EC tablet Take 325 mg by mouth 2 (two) times daily.   Yes [provider]  hydrochlorothiazide (HYDRODIURIL) 25 MG tablet Take 25 mg by mouth daily. 08/13/17  Yes [provider]  levothyroxine (SYNTHROID, LEVOTHROID) 125 MCG tablet Take 125 mcg by mouth daily before breakfast.  05/16/11  Yes [provider]  Melatonin 3 MG TABS Take 1 tablet by mouth daily.   Yes [provider]  multivitamin-lutein (OCUVITE-LUTEIN) CAPS capsule Take 1 capsule by mouth daily.   Yes [provider]  aspirin EC 325 MG tablet Take 1 tablet (325 mg total) by mouth daily. Take x 1 month post op to decrease risk of blood clots. 08/23/17   Marshia Ly, PA-C  traMADol (ULTRAM) 50 MG tablet Take 1 tablet (50 mg total) by mouth every 6 (six) hours as needed. 08/23/17 08/23/18  Marshia Ly, PA-C    Family History Family History  Problem Relation Age of Onset  . Cancer Mother   . Heart disease Mother   . Hyperlipidemia Mother   . Hypertension Mother     Social History Social History   Tobacco Use  . Smoking status: Former Smoker  Types: Cigarettes    Last attempt to quit: 07/14/1996    Years since quitting: 21.1  . Smokeless tobacco: Never Used  Substance Use Topics  . Alcohol use: No  . Drug use: No     Allergies   Patient has no known allergies.   Review of Systems Review of Systems  All other systems reviewed and are negative.    Physical Exam Updated Vital Signs BP 136/72 (BP Location: Right Arm)   Pulse 64   Temp 98 F (36.7 C) (Oral)   Resp 15   Ht 5\' 4"  (1.626 m)   Wt 77.1 kg (170 lb)   SpO2 97%   BMI 29.18 kg/m   Physical Exam  Constitutional: She appears well-developed and well-nourished.  HENT:  Head: Normocephalic and atraumatic.  Cardiovascular: Normal rate and  regular rhythm.  No murmur heard. Pulmonary/Chest: Effort normal and breath sounds normal. No respiratory distress.  Abdominal: Soft. There is no tenderness. There is no rebound and no guarding.  Musculoskeletal: She exhibits no tenderness.  Trace pitting edema to bilateral lower extremities. No tenderness to palpation throughout bilateral hips and lower extremities. There is a slightly antalgic gait.  Neurological: She is alert.  Oriented to person in place. Disoriented to time. Mild confusion. Mild expressive aphasia. Five out of five strength in all four extremities with sensation to light touch intact in all four extremities. No pronator drift.  Skin: Skin is warm and dry.  Psychiatric: She has a normal mood and affect. Her behavior is normal.  Nursing note and vitals reviewed.    ED Treatments / Results  Labs (all labs ordered are listed, but only abnormal results are displayed) Labs Reviewed  COMPREHENSIVE METABOLIC PANEL - Abnormal; Notable for the following components:      Result Value   Glucose, Bld 108 (*)    Creatinine, Ser 1.09 (*)    GFR calc non Af Amer 48 (*)    GFR calc Af Amer 56 (*)    All other components within normal limits  CBC WITH DIFFERENTIAL/PLATELET  URINALYSIS, ROUTINE W REFLEX MICROSCOPIC  CBC  BASIC METABOLIC PANEL  TYPE AND SCREEN  ABO/RH    EKG EKG Interpretation  Date/Time:  Friday August 23 2017 17:04:01 EDT Ventricular Rate:  63 PR Interval:    QRS Duration: 101 QT Interval:  432 QTC Calculation: 443 R Axis:   -29 Text Interpretation:  Sinus rhythm Borderline left axis deviation Nonspecific T abnormalities, lateral leads Confirmed by Tilden Fossaees, Jamielynn Wigley [phone]([id number]) on 08/23/2017 5:14:49 PM   Radiology Dg Chest 1 View  Result Date: 08/23/2017 CLINICAL DATA:  Preop.  Witnessed fall. EXAM: CHEST  1 VIEW COMPARISON:  Radiographs of June 27, 2010. FINDINGS: Stable cardiomediastinal silhouette. No pneumothorax or pleural effusion is noted. Both  lungs are clear. The visualized skeletal structures are unremarkable. IMPRESSION: No acute cardiopulmonary abnormality seen. Electronically Signed   By: Lupita RaiderJames  Green Jr, M.D.   On: 08/23/2017 17:14   Dg C-arm 1-60 Min-no Report  Result Date: 08/23/2017 Fluoroscopy was utilized by the requesting physician.  No radiographic interpretation.   Dg Hip Operative Unilat With Pelvis Right  Result Date: 08/23/2017 CLINICAL DATA:  Right hip pinning, right hip fracture EXAM: OPERATIVE right HIP (WITH PELVIS IF PERFORMED)  VIEWS TECHNIQUE: Fluoroscopic spot image(s) were submitted for interpretation post-operatively. COMPARISON:  Radiographs 08/23/2017 FINDINGS: Two low resolution images of the right hip. Total fluoroscopy time was 1 minutes 18 seconds. The images demonstrate 3 threaded pin fixation of the  proximal right femur for femoral neck fracture. IMPRESSION: Intraoperative fluoroscopic assistance provided during surgical fixation of right femoral neck fracture Electronically Signed   By: Jasmine Pang M.D.   On: 08/23/2017 20:25   Dg Hip Unilat W Or Wo Pelvis 2-3 Views Right  Result Date: 08/23/2017 CLINICAL DATA:  Right hip pain due to a slip and fall today. Initial encounter. EXAM: DG HIP (WITH OR WITHOUT PELVIS) 2-3V RIGHT COMPARISON:  None. FINDINGS: The patient has an acute subcapital fracture of the right hip. No other acute abnormality is identified. No notable degenerative change about the hips. No lytic or sclerotic lesion. Soft tissues unremarkable. IMPRESSION: Acute subcapital fracture right hip. Electronically Signed   By: Drusilla Kanner M.D.   On: 08/23/2017 16:28   Dg Femur Min 2 Views Left  Result Date: 08/23/2017 CLINICAL DATA:  Left upper leg pain due to a fall today. Initial encounter. EXAM: LEFT FEMUR 2 VIEWS COMPARISON:  None. FINDINGS: There is no evidence of fracture or other focal bone lesions. Left knee osteoarthritis is noted. Soft tissues are unremarkable. IMPRESSION: No acute  abnormality. Osteoarthritis left knee. Electronically Signed   By: Drusilla Kanner M.D.   On: 08/23/2017 16:29    Procedures Procedures (including critical care time)  Medications Ordered in ED Medications  hydrALAZINE (APRESOLINE) injection 10 mg (has no administration in time range)  0.9 %  sodium chloride infusion ( Intravenous New Bag/Given 08/23/17 2239)  HYDROmorphone (DILAUDID) injection 0.5 mg (has no administration in time range)  atorvastatin (LIPITOR) tablet 10 mg (10 mg Oral Not Given 08/23/17 2240)  hydrochlorothiazide (HYDRODIURIL) tablet 25 mg (has no administration in time range)  levothyroxine (SYNTHROID, LEVOTHROID) tablet 125 mcg (has no administration in time range)  docusate sodium (COLACE) capsule 100 mg (100 mg Oral Given 08/23/17 2238)  ondansetron (ZOFRAN) tablet 4 mg (has no administration in time range)    Or  ondansetron (ZOFRAN) injection 4 mg (has no administration in time range)  metoCLOPramide (REGLAN) tablet 5-10 mg (has no administration in time range)    Or  metoCLOPramide (REGLAN) injection 5-10 mg (has no administration in time range)  alum & mag hydroxide-simeth (MAALOX/MYLANTA) 200-200-20 MG/5ML suspension 30 mL (has no administration in time range)  aspirin EC tablet 325 mg (has no administration in time range)  ceFAZolin (ANCEF) IVPB 2g/100 mL premix (has no administration in time range)  acetaminophen (TYLENOL) tablet 325-650 mg (has no administration in time range)  HYDROcodone-acetaminophen (NORCO/VICODIN) 5-325 MG per tablet 1-2 tablet (has no administration in time range)  acetaminophen (TYLENOL) tablet 500 mg (500 mg Oral Given 08/23/17 2239)  methocarbamol (ROBAXIN) tablet 500 mg (has no administration in time range)    Or  methocarbamol (ROBAXIN) 500 mg in dextrose 5 % 50 mL IVPB (has no administration in time range)  polyethylene glycol (MIRALAX / GLYCOLAX) packet 17 g (has no administration in time range)  bisacodyl (DULCOLAX) EC tablet 5  mg (has no administration in time range)  calcium-vitamin D (OSCAL WITH D) 500-200 MG-UNIT per tablet 1 tablet (has no administration in time range)  ferrous sulfate tablet 325 mg (has no administration in time range)  acetaminophen (TYLENOL) tablet 650 mg (650 mg Oral Given 08/23/17 1629)  ceFAZolin (ANCEF) 2-4 GM/100ML-% IVPB (  Override pull for Anesthesia 08/23/17 1840)  ceFAZolin (ANCEF) IVPB 2g/100 mL premix (2 g Intravenous Given 08/23/17 1840)     Initial Impression / Assessment and Plan / ED Course  I have reviewed the triage vital signs  and the nursing notes.  Pertinent labs & imaging results that were available during my care of the patient were reviewed by me and considered in my medical decision making (see chart for details).     Patient here for evaluation of injuries following a mechanical fall. She has no complaints in the emergency department but did report hip pain at her assisted living. She has no tenderness on palpation. Plan films do demonstrate a right sub capital femur fracture. Dr. Luiz Blare with orthopedics consulted regarding fracture and hospitalist consulted for admission. Patient and family updated of findings of studies recommendation for admission and they are in agreement with plan.  Final Clinical Impressions(s) / ED Diagnoses   Final diagnoses:  Hip pain, acute, right  Closed fracture of neck of right femur, initial encounter [facility])    ED Discharge Orders        Ordered    Weight bearing as tolerated     08/23/17 2025    traMADol (ULTRAM) 50 MG tablet  Every 6 hours PRN     08/23/17 2025    aspirin EC 325 MG tablet  Daily     08/23/17 2025       Tilden Fossa, MD 08/24/17 [phone]

## 2017-08-23 NOTE — Consult Note (Signed)
Reason for Consult: Right hip pain Referring Physician: Hospitalist  Terri Vasquez is an 77 y.o. female.  HPI: Patient is a 77 year old female who fell earlier today.  She is been walking on her hip since that time.  She presented the emergency room with persistent right hip pain.  She was diagnosed with a femoral neck fracture and we are consulted.  She is been cleared by the internal medicine service as we have discussed the possibility of surgical intervention.  Past Medical History:  Diagnosis Date  . Family history of adverse reaction to anesthesia    sons have difficulty waking up  . Hypertension   . Hypothyroidism   . Thyroid disease   . Varicose veins     Past Surgical History:  Procedure Laterality Date  . foot surgeries     right foot on achilles tendon  . NO PAST SURGERIES      Family History  Problem Relation Age of Onset  . Cancer Mother   . Heart disease Mother   . Hyperlipidemia Mother   . Hypertension Mother     Social History:  reports that she quit smoking about 21 years ago. Her smoking use included cigarettes. She has never used smokeless tobacco. She reports that she does not drink alcohol or use drugs.  Allergies: No Known Allergies  Medications: I have reviewed the patient's current medications.  Results for orders placed or performed during the hospital encounter of 08/23/17 (from the past 48 hour(s))  CBC with Differential     Status: None   Collection Time: 08/23/17  5:06 PM  Result Value Ref Range   WBC 7.1 4.0 - 10.5 K/uL   RBC 4.03 3.87 - 5.11 MIL/uL   Hemoglobin 12.4 12.0 - 15.0 g/dL   HCT 37.0 36.0 - 46.0 %   MCV 91.8 78.0 - 100.0 fL   MCH 30.8 26.0 - 34.0 pg   MCHC 33.5 30.0 - 36.0 g/dL   RDW 13.9 11.5 - 15.5 %   Platelets 268 150 - 400 K/uL   Neutrophils Relative % 76 %   Neutro Abs 5.3 1.7 - 7.7 K/uL   Lymphocytes Relative 16 %   Lymphs Abs 1.2 0.7 - 4.0 K/uL   Monocytes Relative 7 %   Monocytes Absolute 0.5 0.1 - 1.0 K/uL   Eosinophils Relative 1 %   Eosinophils Absolute 0.1 0.0 - 0.7 K/uL   Basophils Relative 0 %   Basophils Absolute 0.0 0.0 - 0.1 K/uL    Comment: Performed at [facility], [place] [address], [place], [place] [address]  Comprehensive metabolic panel     Status: Abnormal   Collection Time: 08/23/17  5:06 PM  Result Value Ref Range   Sodium 140 135 - 145 mmol/L   Potassium 3.6 3.5 - 5.1 mmol/L   Chloride 102 101 - 111 mmol/L   CO2 28 22 - 32 mmol/L   Glucose, Bld 108 (H) 65 - 99 mg/dL   BUN 18 6 - 20 mg/dL   Creatinine, Ser 1.09 (H) 0.44 - 1.00 mg/dL   Calcium 9.1 8.9 - 10.3 mg/dL   Total Protein 7.0 6.5 - 8.1 g/dL   Albumin 3.7 3.5 - 5.0 g/dL   AST 28 15 - 41 U/L   ALT 24 14 - 54 U/L   Alkaline Phosphatase 59 38 - 126 U/L   Total Bilirubin 0.7 0.3 - 1.2 mg/dL   GFR calc non Af Amer 48 (L) >60 mL/min   GFR  calc Af Amer 56 (L) >60 mL/min    Comment: (NOTE) The eGFR has been calculated using the CKD EPI equation. This calculation has not been validated in all clinical situations. eGFR's persistently <60 mL/min signify possible Chronic Kidney Disease.    Anion gap 10 5 - 15    Comment: Performed at [facility], [place] [address], [place], [place] [address]  Urinalysis, Routine w reflex microscopic     Status: None   Collection Time: 08/23/17  5:43 PM  Result Value Ref Range   Color, Urine YELLOW YELLOW   APPearance CLEAR CLEAR   Specific Gravity, Urine 1.008 1.005 - 1.030   pH 7.0 5.0 - 8.0   Glucose, UA NEGATIVE NEGATIVE mg/dL   Hgb urine dipstick NEGATIVE NEGATIVE   Bilirubin Urine NEGATIVE NEGATIVE   Ketones, ur NEGATIVE NEGATIVE mg/dL   Protein, ur NEGATIVE NEGATIVE mg/dL   Nitrite NEGATIVE NEGATIVE   Leukocytes, UA NEGATIVE NEGATIVE    Comment: Performed at [facility] [address], [place], [place] [address]    Dg Chest 1 View  Result Date: 08/23/2017 CLINICAL DATA:  Preop.  Witnessed fall. EXAM: CHEST  1 VIEW  COMPARISON:  Radiographs of June 27, 2010. FINDINGS: Stable cardiomediastinal silhouette. No pneumothorax or pleural effusion is noted. Both lungs are clear. The visualized skeletal structures are unremarkable. IMPRESSION: No acute cardiopulmonary abnormality seen. Electronically Signed   By: Marijo Conception, M.D.   On: 08/23/2017 17:14   Dg Hip Unilat W Or Wo Pelvis 2-3 Views Right  Result Date: 08/23/2017 CLINICAL DATA:  Right hip pain due to a slip and fall today. Initial encounter. EXAM: DG HIP (WITH OR WITHOUT PELVIS) 2-3V RIGHT COMPARISON:  None. FINDINGS: The patient has an acute subcapital fracture of the right hip. No other acute abnormality is identified. No notable degenerative change about the hips. No lytic or sclerotic lesion. Soft tissues unremarkable. IMPRESSION: Acute subcapital fracture right hip. Electronically Signed   By: Inge Rise M.D.   On: 08/23/2017 16:28   Dg Femur Min 2 Views Left  Result Date: 08/23/2017 CLINICAL DATA:  Left upper leg pain due to a fall today. Initial encounter. EXAM: LEFT FEMUR 2 VIEWS COMPARISON:  None. FINDINGS: There is no evidence of fracture or other focal bone lesions. Left knee osteoarthritis is noted. Soft tissues are unremarkable. IMPRESSION: No acute abnormality. Osteoarthritis left knee. Electronically Signed   By: Inge Rise M.D.   On: 08/23/2017 16:29    ROS  ROS: I have reviewed the patient's review of systems thoroughly and there are no positive responses as relates to the HPI. Blood pressure (!) 146/65, pulse 62, temperature 98 F (36.7 C), temperature source Oral, resp. rate 15, height '5\' 4"'$  (1.626 m), weight 77.1 kg (170 lb), SpO2 100 %. Physical Exam Well-developed well-nourished patient in no acute distress. Alert and oriented x3 HEENT:within normal limits Cardiac: Regular rate and rhythm Pulmonary: Lungs clear to auscultation Abdomen: Soft and nontender.  Normal active bowel sounds  Musculoskeletal: (Right hip:  Pain with internal and external rotation.  Minimal soft tissue swelling. Assessment/Plan: 77 year old somewhat demented female who has valgus impacted femoral neck fracture.  She has been walking on it without displacement.//We had a prolonged discussion with her family about treatment options.  We offered them the nonoperative option to watch this to see if it did in fact displace at which time she would need hemiarthroplasty.  We offered them a hemiarthroplasty as well given that she  is demented and will have a difficult time being touchdown weightbearing.  Given the true valgus impacted nature of the fracture and the fact that she been walking on it which makes me feel that it has some stability I felt it not inappropriate to think about cannulated screw fixation for the patient.  Certainly she and the family are well aware of the potential that the cannulated screw fixation could fail.  If that in fact were to happen she would need hemiarthroplasty.  They are well aware of this possibility.  There are further aware of the risks of surgery including bleeding, infection, need for further surgery, failure the fracture to heal, and the slight possibility of death and and around the time of surgery.  Given these risk and understanding and they still wish to proceed with surgical intervention.  Alta Corning 08/23/2017, 6:20 PM

## 2017-08-23 NOTE — Anesthesia Procedure Notes (Signed)
Spinal  Patient location during procedure: OR End time: 08/23/2017 6:38 PM Staffing Resident/CRNA: Noralyn Pick D, CRNA Performed: anesthesiologist and resident/CRNA  Preanesthetic Checklist Completed: patient identified, site marked, surgical consent, pre-op evaluation, timeout performed, IV checked, risks and benefits discussed and monitors and equipment checked Spinal Block Patient position: right lateral decubitus Prep: ChloraPrep Patient monitoring: heart rate, continuous pulse ox and blood pressure Approach: midline Location: L3-4 Injection technique: single-shot Needle Needle type: Spinocan  Needle gauge: 22 G Needle length: 9 cm Additional Notes Expiration date of kit checked and confirmed. Patient tolerated procedure well, without complications.

## 2017-08-23 NOTE — H&P (Signed)
History and Physical    Terri Vasquez RUE:[id number] DOB: September 10, 1940 DOA: 08/23/2017  PCP: Renford Dills, MD Patient coming from: Assisted living  Chief Complaint: Fall  HPI: Terri Vasquez is a 77 y.o. female with medical history significant of hypothyroidism and varicose veins she was walking in her apartment slipped on the carpet and fell and has a right hip fracture.  She denied any chest pain shortness of breath nausea vomiting lightheadedness dizziness changes with her vision headache abdominal pain or urinary complaints.  Denied fever chills or cough.  She was in her usual state of health she is a very pleasant young lady.    ED Course: ED physician spoke to Dr. Luiz Blare who would like to take for surgery tonight.  Review of Systems: As per HPI otherwise all other systems reviewed and are negative  Ambulatory Status: She is ambulates without a walker.  And independent with all her ADLs.  Past Medical History:  Diagnosis Date  . Thyroid disease   . Varicose veins     Past Surgical History:  Procedure Laterality Date  . foot surgeries     right foot on achilles tendon    Social History   Socioeconomic History  . Marital status: Married    Spouse name: Not on file  . Number of children: Not on file  . Years of education: Not on file  . Highest education level: Not on file  Occupational History  . Not on file  Social Needs  . Financial resource strain: Not on file  . Food insecurity:    Worry: Not on file    Inability: Not on file  . Transportation needs:    Medical: Not on file    Non-medical: Not on file  Tobacco Use  . Smoking status: Former Smoker    Types: Cigarettes    Last attempt to quit: 07/14/1996    Years since quitting: 21.1  . Smokeless tobacco: Never Used  Substance and Sexual Activity  . Alcohol use: No  . Drug use: No  . Sexual activity: Not on file  Lifestyle  . Physical activity:    Days per week: Not on file    Minutes per session: Not on  file  . Stress: Not on file  Relationships  . Social connections:    Talks on phone: Not on file    Gets together: Not on file    Attends religious service: Not on file    Active member of club or organization: Not on file    Attends meetings of clubs or organizations: Not on file    Relationship status: Not on file  . Intimate partner violence:    Fear of current or ex partner: Not on file    Emotionally abused: Not on file    Physically abused: Not on file    Forced sexual activity: Not on file  Other Topics Concern  . Not on file  Social History Narrative   Lives with husband in one story house   Has 2 adult children   Retired from [employer]    No Known Allergies  Family History  Problem Relation Age of Onset  . Cancer Mother   . Heart disease Mother   . Hyperlipidemia Mother   . Hypertension Mother       Prior to Admission medications   Medication Sig Start Date End Date Taking? Authorizing Provider  atorvastatin (LIPITOR) 10 MG tablet Take 10 mg by mouth  daily.  05/20/11  Yes [provider]  Calcium Carb-Cholecalciferol (CALCIUM 500/D) 500-400 MG-UNIT CHEW Chew 1 tablet by mouth daily.   Yes [provider]  ferrous sulfate 325 (65 FE) MG EC tablet Take 325 mg by mouth 2 (two) times daily.   Yes [provider]  hydrochlorothiazide (HYDRODIURIL) 25 MG tablet Take 25 mg by mouth daily. 08/13/17  Yes [provider]  levothyroxine (SYNTHROID, LEVOTHROID) 125 MCG tablet Take 125 mcg by mouth daily before breakfast.  05/16/11  Yes [provider]  Melatonin 3 MG TABS Take 1 tablet by mouth daily.   Yes [provider]  multivitamin-lutein (OCUVITE-LUTEIN) CAPS capsule Take 1 capsule by mouth daily.   Yes [provider]    Physical Exam: Very pleasant young lady in no acute distress. Vitals:   08/23/17 1527 08/23/17 1630 08/23/17 1715 08/23/17 1730  BP: (!) 156/77 (!) 157/91 (!) 142/79 (!)  146/65  Pulse: 67 64 60 62  Resp: 16 14 17 15   Temp: 98 F (36.7 C)     TempSrc: Oral     SpO2: 96% 97% 99% 100%  Weight: 77.1 kg (170 lb)     Height: 5\' 4"  (1.626 m)        General: Appears calm and comfortable Eyes:  PERRL, EOMI, normal lids, iris ZOX:WRUEAVWENT:grossly normal hearing, lips & tongue, mmm Neck:  no LAD, masses or thyromegaly Cardiovascular: RRR, no m/r/g. No LE edema.  Respiratory: CTA bilaterally, no w/r/r. Normal respiratory effort. Abdomen: soft, ntnd, NABS Skin: no rash or induration seen on limited exam Musculoskeletal:  R hip tender to touch. Psychiatric:  grossly normal mood and affect, speech fluent and appropriate, AOx3 Neurologic: CN 2-12 grossly intact, moves all extremities in coordinated fashion, sensation intact  Labs on Admission: I have personally reviewed following labs and imaging studies  CBC: Recent Labs  Lab 08/23/17 1706  WBC 7.1  NEUTROABS 5.3  HGB 12.4  HCT 37.0  MCV 91.8  PLT 268   Basic Metabolic Panel: Recent Labs  Lab 08/23/17 1706  NA 140  K 3.6  CL 102  CO2 28  GLUCOSE 108*  BUN 18  CREATININE 1.09*  CALCIUM 9.1   GFR: Estimated Creatinine Clearance: 44.2 mL/min (A) (by C-G formula based on SCr of 1.09 mg/dL (H)). Liver Function Tests: Recent Labs  Lab 08/23/17 1706  AST 28  ALT 24  ALKPHOS 59  BILITOT 0.7  PROT 7.0  ALBUMIN 3.7   No results for input(s): LIPASE, AMYLASE in the last 168 hours. No results for input(s): AMMONIA in the last 168 hours. Coagulation Profile: No results for input(s): INR, PROTIME in the last 168 hours. Cardiac Enzymes: No results for input(s): CKTOTAL, CKMB, CKMBINDEX, TROPONINI in the last 168 hours. BNP (last 3 results) No results for input(s): PROBNP in the last 8760 hours. HbA1C: No results for input(s): HGBA1C in the last 72 hours. CBG: No results for input(s): GLUCAP in the last 168 hours. Lipid Profile: No results for input(s): CHOL, HDL, LDLCALC, TRIG, CHOLHDL, LDLDIRECT  in the last 72 hours. Thyroid Function Tests: No results for input(s): TSH, T4TOTAL, FREET4, T3FREE, THYROIDAB in the last 72 hours. Anemia Panel: No results for input(s): VITAMINB12, FOLATE, FERRITIN, TIBC, IRON, RETICCTPCT in the last 72 hours. Urine analysis:    Component Value Date/Time   COLORURINE STRAW (A) 09/22/2016 2030   APPEARANCEUR Clear 09/26/2016 0625   LABSPEC 1.003 (L) 09/22/2016 2030   PHURINE 7.0 09/22/2016 2030   GLUCOSEU Negative  09/26/2016 0625   HGBUR NEGATIVE 09/22/2016 2030   BILIRUBINUR Negative 09/26/2016 0625   KETONESUR NEGATIVE 09/22/2016 2030   PROTEINUR Trace 09/26/2016 0625   PROTEINUR NEGATIVE 09/22/2016 2030   NITRITE Negative 09/26/2016 0625   NITRITE NEGATIVE 09/22/2016 2030   LEUKOCYTESUR Negative 09/26/2016 0625    Creatinine Clearance: Estimated Creatinine Clearance: 44.2 mL/min (A) (by C-G formula based on SCr of 1.09 mg/dL (H)).  Sepsis Labs: @LABRCNTIP (procalcitonin:4,lacticidven:4) )No results found for this or any previous visit (from the past 240 hour(s)).   Radiological Exams on Admission: Dg Chest 1 View  Result Date: 08/23/2017 CLINICAL DATA:  Preop.  Witnessed fall. EXAM: CHEST  1 VIEW COMPARISON:  Radiographs of June 27, 2010. FINDINGS: Stable cardiomediastinal silhouette. No pneumothorax or pleural effusion is noted. Both lungs are clear. The visualized skeletal structures are unremarkable. IMPRESSION: No acute cardiopulmonary abnormality seen. Electronically Signed   By: Lupita Raider, M.D.   On: 08/23/2017 17:14   Dg Hip Unilat W Or Wo Pelvis 2-3 Views Right  Result Date: 08/23/2017 CLINICAL DATA:  Right hip pain due to a slip and fall today. Initial encounter. EXAM: DG HIP (WITH OR WITHOUT PELVIS) 2-3V RIGHT COMPARISON:  None. FINDINGS: The patient has an acute subcapital fracture of the right hip. No other acute abnormality is identified. No notable degenerative change about the hips. No lytic or sclerotic lesion. Soft  tissues unremarkable. IMPRESSION: Acute subcapital fracture right hip. Electronically Signed   By: Drusilla Kanner M.D.   On: 08/23/2017 16:28   Dg Femur Min 2 Views Left  Result Date: 08/23/2017 CLINICAL DATA:  Left upper leg pain due to a fall today. Initial encounter. EXAM: LEFT FEMUR 2 VIEWS COMPARISON:  None. FINDINGS: There is no evidence of fracture or other focal bone lesions. Left knee osteoarthritis is noted. Soft tissues are unremarkable. IMPRESSION: No acute abnormality. Osteoarthritis left knee. Electronically Signed   By: Drusilla Kanner M.D.   On: 08/23/2017 16:29    EKG: Nonspecific ST-T wave changes no old EKG to compare.  Assessment/Plan Active Problems:   * No active hospital problems. *   1] status post acute subcapital fracture of the right hip-patient admitted status post mechanical fall no cardiac or neuro events prior or after the fall.  EKG shows nonspecific changes chest x-ray is negative UA is pending labs unremarkable.  He is n.p.o. we will place her on IV fluids in preparation for surgery.  2] hypertension I will place her on IV hydralazine as needed.  3] hypothyroidism restart Synthroid tomorrow.  4] AKI creatinine 1.09 up from 1.0 which is her baseline.  Hold HCTZ slow IV hydration.  Follow-up labs tomorrow.    DVT prophylaxis: SCD for now Lovenox after hip repair. Code Status: Full code Family Communication discussed with son  Disposition Plan: TBD Consults called: ED physician spoke to Ortho Admission status: Inpatient   Alwyn Ren MD [facility]  If 7PM-7AM, please contact night-coverage [email or web address] Password [facility]  08/23/2017, 5:54 PM

## 2017-08-23 NOTE — ED Notes (Signed)
Pt ambulated, but had pain in her right hip. Pants removed, no bruising noted.

## 2017-08-23 NOTE — Brief Op Note (Signed)
08/23/2017  7:55 PM  PATIENT:  Terri Vasquez  77 y.o. female  PRE-OPERATIVE DIAGNOSIS:  right hip valgus impacted femoral neck fracture  POST-OPERATIVE DIAGNOSIS:  right hip valgus impacted femoral neck fracture  PROCEDURE:  Procedure(s): CANNULATED HIP PINNING RIGHT (Right)  SURGEON:  Surgeon(s) and Role:    Jodi Geralds* Giavonni Cizek, MD - Primary  PHYSICIAN ASSISTANT:   ASSISTANTS: bethune   ANESTHESIA:   spinal  EBL:  minimal   BLOOD ADMINISTERED:none  DRAINS: none   LOCAL MEDICATIONS USED:  MARCAINE     SPECIMEN:  No Specimen  DISPOSITION OF SPECIMEN:  N/A  COUNTS:  YES  TOURNIQUET:  * No tourniquets in log *  DICTATION: .Other Dictation: Dictation Number [id number]  PLAN OF CARE: Admit to inpatient   PATIENT DISPOSITION:  PACU - hemodynamically stable.   Delay start of Pharmacological VTE agent (>24hrs) due to surgical blood loss or risk of bleeding: no

## 2017-08-23 NOTE — ED Notes (Signed)
UA and culture sent to lab

## 2017-08-23 NOTE — Op Note (Signed)
NAME: Terri Vasquez, Finnleigh B. MEDICAL RECORD ZO:[id number] ACCOUNT [id number] DATE OF BIRTH:11-Nov-1940 FACILITY: [facility] LOCATION: [facility] PHYSICIAN:Kade Rickels Starling MannsL. Shanieka Blea, MD  OPERATIVE REPORT  DATE OF PROCEDURE:  08/23/2017  She is a 77 year old female in the internal medicine service.    PREOPERATIVE DIAGNOSIS:  Femoral neck fracture, valgus impacted.  POSTOPERATIVE DIAGNOSIS:  Femoral neck fracture, valgus impacted.  PROCEDURE: 1.  Open reduction internal fixation of femoral neck fracture with 38 mm cannulated screws. 2.  Interpretation of multiple intraoperative fluoroscopic images.  SURGEON:  Jodi GeraldsJohn Bridgett Hattabaugh, MD  ASSISTANT:  Patrick Jupiterarla Bethune, RNFA   ANESTHESIA:  Spinal.  BRIEF HISTORY:  The patient is a 77 year old female with a history of falling today.  She had walked on her hip for a while but complained of continued hip pain, and because of that,  she was taken to the emergency room where she was noted to have a  valgus-impacted femoral neck fracture.  We talked about treatment options including the possibility of observation versus cannulated screw fixation versus hemiarthroplasty.  We chose a cannulated screw fixation.  She was brought to the operating room for  this procedure.  DESCRIPTION OF PROCEDURE:  The patient was brought to the operating room.  After adequate anesthesia was obtained with a general anesthetic, the patient was placed supine on the operating table.  The right hip was then prepped and draped in the usual  sterile fashion.  She was placed onto the Hana bed so that we could get a good fluoroscopic visualization.  Following this, a small incision was made and subcutaneous taken down to the level of the femur.  The femur was identified, and a free-hand  guidewire was placed into the inferior portion of the bone.  Following this, one directly in parallel was placed superior, and following this, one was placed anterior, aiming posterior, and this gave nice fixation into the head.   These were measured and  then advanced under direct fluoroscopic guidance, giving excellent fixation into the portion of the femoral head.  Once this was done, the wounds were irrigated and suctioned dry.  Tensor fascia was closed with an interrupted figure-of-eight.  Skin was  closed with 2-0 Vicryl and skin staples.  A sterile compressive dressing was applied, and the patient was taken to recovery room in satisfactory condition.  Of note, multiple intraoperative fluoroscopic images were taken.  I actually went on live fluoro  to assess the screw length.  At this point, the patient was taken to recovery.  She was noted to be in satisfactory condition.  Estimated blood loss for procedure was minimal.  LN/NUANCE  D:08/23/2017 T:08/23/2017 JOB:[id number]

## 2017-08-23 NOTE — ED Triage Notes (Signed)
Pt c/o right sided hip pain from a fall. Pt slipped, witnessed by husband. Pt ambulatory with EMS.

## 2017-08-23 NOTE — Anesthesia Preprocedure Evaluation (Addendum)
Anesthesia Evaluation  Patient identified by MRN, date of birth, ID band Patient awake    Reviewed: Allergy & Precautions, NPO status , Patient's Chart, lab work & pertinent test results  Airway Mallampati: II  TM Distance: >3 FB Neck ROM: Full    Dental  (+) Teeth Intact, Dental Advisory Given   Pulmonary former smoker,    Pulmonary exam normal breath sounds clear to auscultation       Cardiovascular hypertension, Pt. on medications + Peripheral Vascular Disease  Normal cardiovascular exam Rhythm:Regular Rate:Normal     Neuro/Psych Mild dementia     GI/Hepatic negative GI ROS, Neg liver ROS,   Endo/Other  Hypothyroidism   Renal/GU negative Renal ROS     Musculoskeletal negative musculoskeletal ROS (+) Arthritis ,   Abdominal   Peds  Hematology negative hematology ROS (+) Plt 268k   Anesthesia Other Findings Day of surgery medications reviewed with the patient.  Reproductive/Obstetrics                             Anesthesia Physical Anesthesia Plan  ASA: II and emergent  Anesthesia Plan: Spinal   Post-op Pain Management:    Induction:   PONV Risk Score and Plan: 2 and Propofol infusion, Ondansetron and Treatment may vary due to age or medical condition  Airway Management Planned: Simple Face Mask and Natural Airway  Additional Equipment:   Intra-op Plan:   Post-operative Plan:   Informed Consent: I have reviewed the patients History and Physical, chart, labs and discussed the procedure including the risks, benefits and alternatives for the proposed anesthesia with the patient or authorized representative who has indicated his/her understanding and acceptance.   Dental advisory given  Plan Discussed with: CRNA, Anesthesiologist and Surgeon  Anesthesia Plan Comments: (Discussed spinal in lateral position with patient and patient's son.  All questions answered.)        Anesthesia Quick Evaluation

## 2017-08-23 NOTE — ED Notes (Signed)
Bed: WA03 Expected date:  Expected time:  Means of arrival:  Comments: 77 yo witnessed fall, Hip pain

## 2017-08-23 NOTE — Transfer of Care (Signed)
Immediate Anesthesia Transfer of Care Note  Patient: Terri Vasquez  Procedure(s) Performed: CANNULATED HIP PINNING RIGHT (Right Hip)  Patient Location: PACU  Anesthesia Type:Regional  Level of Consciousness: awake, alert  and oriented  Airway & Oxygen Therapy: Patient Spontanous Breathing and Patient connected to face mask oxygen  Post-op Assessment: Report given to RN and Post -op Vital signs reviewed and stable  Post vital signs: Reviewed and stable  Last Vitals:  Vitals Value Taken Time  BP    Temp    Pulse 65 08/23/2017  8:10 PM  Resp 13 08/23/2017  8:10 PM  SpO2 94 % 08/23/2017  8:10 PM  Vitals shown include unvalidated device data.  Last Pain:  Vitals:   08/23/17 1527  TempSrc: Oral  PainSc: 2          Complications: No apparent anesthesia complications

## 2017-08-24 ENCOUNTER — Encounter (HOSPITAL_COMMUNITY): Payer: Self-pay | Admitting: Internal Medicine

## 2017-08-24 ENCOUNTER — Other Ambulatory Visit: Payer: Self-pay

## 2017-08-24 DIAGNOSIS — I10 Essential (primary) hypertension: Secondary | ICD-10-CM

## 2017-08-24 DIAGNOSIS — S72001A Fracture of unspecified part of neck of right femur, initial encounter for closed fracture: Secondary | ICD-10-CM

## 2017-08-24 DIAGNOSIS — N179 Acute kidney failure, unspecified: Secondary | ICD-10-CM

## 2017-08-24 DIAGNOSIS — E039 Hypothyroidism, unspecified: Secondary | ICD-10-CM

## 2017-08-24 HISTORY — DX: Hypothyroidism, unspecified: E03.9

## 2017-08-24 HISTORY — DX: Essential (primary) hypertension: I10

## 2017-08-24 LAB — BASIC METABOLIC PANEL
Anion gap: 11 (ref 5–15)
BUN: 15 mg/dL (ref 6–20)
CALCIUM: 8.9 mg/dL (ref 8.9–10.3)
CO2: 28 mmol/L (ref 22–32)
CREATININE: 0.87 mg/dL (ref 0.44–1.00)
Chloride: 102 mmol/L (ref 101–111)
GFR calc non Af Amer: >60 mL/min (ref >60)
GLUCOSE: 151 mg/dL — AB (ref 65–99)
Potassium: 4.1 mmol/L (ref 3.5–5.1)
Sodium: 141 mmol/L (ref 135–145)

## 2017-08-24 LAB — CBC
HEMATOCRIT: 34.9 % — AB (ref 36.0–46.0)
Hemoglobin: 11.8 g/dL — ABNORMAL LOW (ref 12.0–15.0)
MCH: 30.3 pg (ref 26.0–34.0)
MCHC: 33.8 g/dL (ref 30.0–36.0)
MCV: 89.7 fL (ref 78.0–100.0)
Platelets: 279 10*3/uL (ref 150–400)
RBC: 3.89 MIL/uL (ref 3.87–5.11)
RDW: 13.8 % (ref 11.5–15.5)
WBC: 9.5 10*3/uL (ref 4.0–10.5)

## 2017-08-24 MED ORDER — ASPIRIN EC 325 MG PO TBEC
325.0000 mg | DELAYED_RELEASE_TABLET | Freq: Two times a day (BID) | ORAL | Status: DC
  Administered 2017-08-24 – 2017-08-26 (×4): 325 mg via ORAL
  Filled 2017-08-24 (×4): qty 1

## 2017-08-24 MED ORDER — BISACODYL 5 MG PO TBEC
10.0000 mg | DELAYED_RELEASE_TABLET | ORAL | Status: AC
  Administered 2017-08-24: 10 mg via ORAL
  Filled 2017-08-24: qty 2

## 2017-08-24 NOTE — Plan of Care (Signed)
Plan of care discussed with patients son. Patient is confused and is unable to learn

## 2017-08-24 NOTE — Care Management Note (Signed)
Case Management Note  Patient Details  Name: Terri Vasquez MRN: [id number] Date of Birth: 1940-03-07  Subjective/Objective:  Fall, s/p right cannulated hip pinning                  Action/Plan: NCM spoke to son, Terri Vasquez. They are requesting SNF rehab. CSW referral for SNF. Pt lives at [facility].   Expected Discharge Date:  08/27/17               Expected Discharge Plan:  Skilled Nursing Facility  In-House Referral:  Clinical Social Work  Discharge planning Services  CM Consult  Post Acute Care Choice:  NA Choice offered to:  NA  DME Arranged:  N/A DME Agency:  NA  HH Arranged:  NA HH Agency:  NA  Status of Service:  Completed, signed off  If discussed at [employer] Length of Stay Meetings, dates discussed:    Additional Comments:  Terri Vasquez, Terri Massing [name], RN 08/24/2017, 2:43 PM

## 2017-08-24 NOTE — Evaluation (Signed)
Physical Therapy Evaluation Patient Details Name: Terri Vasquez MRN: [id number] DOB: Sep 04, 1940 Today's Date: 08/24/2017   History of Present Illness  77 yo female with History of dementia [facility], admitted 08/23/17 after a fall and sustained impacted femoral neck on the  right. S/P  CANNULATED HIP PINNING RIGHT (Right on 08/23/17  Clinical Impression  There patient was able to participate and  Ambulate x 50' today with RW. Idicates some discomfort of thr right hip. Pt admitted with above diagnosis. Pt currently with functional limitations due to the deficits listed below (see PT Problem List).  Pt will benefit from skilled PT to increase their independence and safety with mobility to allow discharge to the venue listed below.       Follow Up Recommendations Home health PT if able to return to ALF  Vs SNF(will need 24/7 caregivers)    Equipment Recommendations  Rolling walker with 5" wheels    Recommendations for Other Services       Precautions / Restrictions Precautions Precautions: Fall Precaution Comments: safety Restrictions Weight Bearing Restrictions: No      Mobility  Bed Mobility Overal bed mobility: Needs Assistance Bed Mobility: Supine to Sit     Supine to sit: Min assist;HOB elevated     General bed mobility comments: extra time, assist with the right leg  Transfers Overall transfer level: Needs assistance Equipment used: Rolling walker (2 wheeled) Transfers: Sit to/from Stand Sit to Stand: Min assist         General transfer comment: cues for hand placement  Ambulation/Gait Ambulation/Gait assistance: Min assist Gait Distance (Feet): 50 Feet Assistive device: Rolling walker (2 wheeled) Gait Pattern/deviations: Step-to pattern;Antalgic     General Gait Details: cues for use of RW  Stairs            Wheelchair Mobility    Modified Rankin (Stroke Patients Only)       Balance                                             Pertinent Vitals/Pain Pain Assessment: Faces Faces Pain Scale: Hurts little more Pain Location: right hip Pain Descriptors / Indicators: Grimacing Pain Intervention(s): Monitored during session;Ice applied    Home Living Family/patient expects to be discharged to:: Assisted living                 Additional Comments: patient unable to provode info    Prior Function Level of Independence: Independent         Comments: no family present for info     Hand Dominance        Extremity/Trunk Assessment   Upper Extremity Assessment Upper Extremity Assessment: Overall WFL for tasks assessed    Lower Extremity Assessment Lower Extremity Assessment: RLE deficits/detail RLE Deficits / Details: bears weight        Communication   Communication: Receptive difficulties;Expressive difficulties  Cognition Arousal/Alertness: Awake/alert Behavior During Therapy: WFL for tasks assessed/performed Overall Cognitive Status: History of cognitive impairments - at baseline                                 General Comments: able to follow simple directions      General Comments      Exercises     Assessment/Plan    PT  Assessment Patient needs continued PT services  PT Problem List Decreased strength;Decreased cognition;Decreased knowledge of use of DME;Decreased range of motion;Decreased activity tolerance;Decreased safety awareness;Decreased knowledge of precautions;Decreased mobility;Pain       PT Treatment Interventions DME instruction;Gait training;Functional mobility training;Therapeutic activities;Therapeutic exercise;Patient/family education    PT Goals (Current goals can be found in the Care Plan section)  Acute Rehab PT Goals Patient Stated Goal: unable PT Goal Formulation: Patient unable to participate in goal setting Time For Goal Achievement: 09/07/17 Potential to Achieve Goals: Good    Frequency Min 2X/week   Barriers to  discharge        Co-evaluation               AM-PAC PT "6 Clicks" Daily Activity  Outcome Measure Difficulty turning over in bed (including adjusting bedclothes, sheets and blankets)?: A Lot Difficulty moving from lying on back to sitting on the side of the bed? : A Lot Difficulty sitting down on and standing up from a chair with arms (e.g., wheelchair, bedside commode, etc,.)?: A Lot Help needed moving to and from a bed to chair (including a wheelchair)?: A Lot Help needed walking in hospital room?: A Lot Help needed climbing 3-5 steps with a railing? : Total 6 Click Score: 11    End of Session Equipment Utilized During Treatment: Gait belt Activity Tolerance: Patient tolerated treatment well Patient left: in chair;with call bell/phone within reach;with chair alarm set Nurse Communication: Mobility status PT Visit Diagnosis: Unsteadiness on feet (R26.81);Pain Pain - Right/Left: Right Pain - part of body: Hip    Time: [id number] PT Time Calculation (min) (ACUTE ONLY): 19 min   Charges:   PT Evaluation $PT Eval Low Complexity: 1 Low     PT G CodesBlanchard Kelch:        Jedediah Noda PT [phone]   Rada HayHill, Hellena Pridgen Elizabeth 08/24/2017, 10:43 AM

## 2017-08-24 NOTE — Anesthesia Postprocedure Evaluation (Signed)
Anesthesia Post Note  Patient: Terri Vasquez  Procedure(s) Performed: CANNULATED HIP PINNING RIGHT (Right Hip)     Patient location during evaluation: PACU Anesthesia Type: Spinal Level of consciousness: oriented and awake and alert Pain management: pain level controlled Vital Signs Assessment: post-procedure vital signs reviewed and stable Respiratory status: spontaneous breathing, respiratory function stable, patient connected to nasal cannula oxygen and nonlabored ventilation Cardiovascular status: blood pressure returned to baseline and stable Postop Assessment: no headache, no backache, no apparent nausea or vomiting, spinal receding and patient able to bend at knees Anesthetic complications: no    Last Vitals:  Vitals:   08/24/17 0941 08/24/17 1338  BP: 120/69 (!) 109/53  Pulse: 66 68  Resp:    Temp: 36.7 C 36.7 C  SpO2: 95% 97%    Last Pain:  Vitals:   08/24/17 1813  TempSrc:   PainSc: 2                  Cecile HearingStephen Edward Jalacia Mattila

## 2017-08-24 NOTE — Progress Notes (Signed)
Subjective: 1 Day Post-Op Procedure(s) (LRB): CANNULATED HIP PINNING RIGHT (Right) Patient reports pain as mild.    Objective: Vital signs in last 24 hours: Temp:  [97.4 F (36.3 C)-98.6 F (37 C)] 98.2 F (36.8 C) (06/22 0612) Pulse Rate:  [58-67] 66 (06/22 0612) Resp:  [13-18] 15 (06/22 0133) BP: (113-157)/(65-91) 137/75 (06/22 0612) SpO2:  [94 %-100 %] 100 % (06/22 0612) Weight:  [77.1 kg (170 lb)] 77.1 kg (170 lb) (06/21 1527)  Intake/Output from previous day: 06/21 0701 - 06/22 0700 In: 1944.2 [P.O.:100; I.V.:1767.5; IV Piggyback:76.7] Out: 2450 [Urine:2425; Blood:25] Intake/Output this shift: Total I/O In: 386.7 [I.V.:253.3; IV Piggyback:133.3] Out: -   Recent Labs    08/23/17 1706 08/24/17 0511  HGB 12.4 11.8*   Recent Labs    08/23/17 1706 08/24/17 0511  WBC 7.1 9.5  RBC 4.03 3.89  HCT 37.0 34.9*  PLT 268 279   Recent Labs    08/23/17 1706 08/24/17 0511  NA 140 141  K 3.6 4.1  CL 102 102  CO2 28 28  BUN 18 15  CREATININE 1.09* 0.87  GLUCOSE 108* 151*  CALCIUM 9.1 8.9   No results for input(s): LABPT, INR in the last 72 hours.  Neurologically intact ABD soft Neurovascular intact Intact pulses distally No cellulitis present Compartment soft    Assessment/Plan: 1 Day Post-Op Procedure(s) (LRB): CANNULATED HIP PINNING RIGHT (Right) Advance diet Up with therapy Discharge to SNF will be the likely outcome as I do not know that she will be able to return immediately to assisted living.    Terri Vasquez 08/24/2017, 8:24 AM

## 2017-08-24 NOTE — Progress Notes (Signed)
PROGRESS NOTE    Terri Vasquez  UJW:[id number] DOB: [birth year] DOA: 08/23/2017 PCP: Renford Dills, MD    Brief Narrative:77 y.o. female with medical history significant of hypothyroidism and varicose veins she was walking in her apartment slipped on the carpet and fell and has a right hip fracture.  She denied any chest pain shortness of breath nausea vomiting lightheadedness dizziness changes with her vision headache abdominal pain or urinary complaints.  Denied fever chills or cough.  She was in her usual state of health she is a very pleasant young lady.    ED Course: ED physician spoke to Dr. Luiz Blare who would like to take for surgery tonight.  Review of Systems: As per HPI otherwise all other systems reviewed and are negative  Ambulatory Status: She is ambulates without a walker.  And independent with all her ADLs.    Assessment & Plan:   Principal Problem:   Fracture of femoral neck, right (HCC) Active Problems:   Memory loss   1] status post  pinning  of the right acute subcapital fracture hip-patient admitted status post mechanical fall.  PT notes noted.   2] constipation STOOL SOFTNERS  3] hypertension I will restart her home medications and she is able to take p.o. at this time.  4] hypothyroidism restart Synthroid.  5] AKI improved with IV hydration  DVT prophylaxis: aspirin 325 mg bid for 4 weeks. Code Status:FULL Family Communication:NONE Disposition Plan SNF WHEN OK WITH ORTHO Consultants: DR GRAVES ORTHO   Procedures: R HIP  Antimicrobials NONE Subjective:Complains of pain on movement.   Objective: Vitals:   08/23/17 2318 08/24/17 0133 08/24/17 0612 08/24/17 0941  BP: 136/72 127/67 137/75 120/69  Pulse: 64 67 66 66  Resp: 15 15    Temp: 98 F (36.7 C) 97.8 F (36.6 C) 98.2 F (36.8 C) 98.1 F (36.7 C)  TempSrc: Oral Oral Oral Oral  SpO2: 97% 97% 100% 95%  Weight:      Height:        Intake/Output Summary (Last 24 hours) at  08/24/2017 1136 Last data filed at 08/24/2017 5621 Gross per 24 hour  Intake 2570.83 ml  Output 2750 ml  Net -179.17 ml   Filed Weights   08/23/17 1527  Weight: 77.1 kg (170 lb)    Examination:  General exam: Appears calm and comfortable  Respiratory system: Clear to auscultation. Respiratory effort normal. Cardiovascular system: S1 & S2 heard, RRR. No JVD, murmurs, rubs, gallops or clicks. No pedal edema. Gastrointestinal system: Abdomen is nondistended, soft and nontender. No organomegaly or masses felt. Normal bowel sounds heard. Central nervous system: Alert and oriented. No focal neurological deficits. Extremities: dressing noted right hip no erythema  Skin: No rashes, lesions or ulcers Psychiatry: Judgement and insight appear normal. Mood & affect appropriate.     Data Reviewed: I have personally reviewed following labs and imaging studies  CBC: Recent Labs  Lab 08/23/17 1706 08/24/17 0511  WBC 7.1 9.5  NEUTROABS 5.3  --   HGB 12.4 11.8*  HCT 37.0 34.9*  MCV 91.8 89.7  PLT 268 279   Basic Metabolic Panel: Recent Labs  Lab 08/23/17 1706 08/24/17 0511  NA 140 141  K 3.6 4.1  CL 102 102  CO2 28 28  GLUCOSE 108* 151*  BUN 18 15  CREATININE 1.09* 0.87  CALCIUM 9.1 8.9   GFR: Estimated Creatinine Clearance: 55.3 mL/min (by C-G formula based on SCr of 0.87 mg/dL). Liver Function Tests: Recent Labs  Lab  08/23/17 1706  AST 28  ALT 24  ALKPHOS 59  BILITOT 0.7  PROT 7.0  ALBUMIN 3.7   No results for input(s): LIPASE, AMYLASE in the last 168 hours. No results for input(s): AMMONIA in the last 168 hours. Coagulation Profile: No results for input(s): INR, PROTIME in the last 168 hours. Cardiac Enzymes: No results for input(s): CKTOTAL, CKMB, CKMBINDEX, TROPONINI in the last 168 hours. BNP (last 3 results) No results for input(s): PROBNP in the last 8760 hours. HbA1C: No results for input(s): HGBA1C in the last 72 hours. CBG: No results for input(s):  GLUCAP in the last 168 hours. Lipid Profile: No results for input(s): CHOL, HDL, LDLCALC, TRIG, CHOLHDL, LDLDIRECT in the last 72 hours. Thyroid Function Tests: No results for input(s): TSH, T4TOTAL, FREET4, T3FREE, THYROIDAB in the last 72 hours. Anemia Panel: No results for input(s): VITAMINB12, FOLATE, FERRITIN, TIBC, IRON, RETICCTPCT in the last 72 hours. Sepsis Labs: No results for input(s): PROCALCITON, LATICACIDVEN in the last 168 hours.  No results found for this or any previous visit (from the past 240 hour(s)).       Radiology Studies: Dg Chest 1 View  Result Date: 08/23/2017 CLINICAL DATA:  Preop.  Witnessed fall. EXAM: CHEST  1 VIEW COMPARISON:  Radiographs of June 27, 2010. FINDINGS: Stable cardiomediastinal silhouette. No pneumothorax or pleural effusion is noted. Both lungs are clear. The visualized skeletal structures are unremarkable. IMPRESSION: No acute cardiopulmonary abnormality seen. Electronically Signed   By: Lupita RaiderJames  Green Jr, M.D.   On: 08/23/2017 17:14   Dg C-arm 1-60 Min-no Report  Result Date: 08/23/2017 Fluoroscopy was utilized by the requesting physician.  No radiographic interpretation.   Dg Hip Operative Unilat With Pelvis Right  Result Date: 08/23/2017 CLINICAL DATA:  Right hip pinning, right hip fracture EXAM: OPERATIVE right HIP (WITH PELVIS IF PERFORMED)  VIEWS TECHNIQUE: Fluoroscopic spot image(s) were submitted for interpretation post-operatively. COMPARISON:  Radiographs 08/23/2017 FINDINGS: Two low resolution images of the right hip. Total fluoroscopy time was 1 minutes 18 seconds. The images demonstrate 3 threaded pin fixation of the proximal right femur for femoral neck fracture. IMPRESSION: Intraoperative fluoroscopic assistance provided during surgical fixation of right femoral neck fracture Electronically Signed   By: Jasmine PangKim  Fujinaga M.D.   On: 08/23/2017 20:25   Dg Hip Unilat W Or Wo Pelvis 2-3 Views Right  Result Date: 08/23/2017 CLINICAL  DATA:  Right hip pain due to a slip and fall today. Initial encounter. EXAM: DG HIP (WITH OR WITHOUT PELVIS) 2-3V RIGHT COMPARISON:  None. FINDINGS: The patient has an acute subcapital fracture of the right hip. No other acute abnormality is identified. No notable degenerative change about the hips. No lytic or sclerotic lesion. Soft tissues unremarkable. IMPRESSION: Acute subcapital fracture right hip. Electronically Signed   By: Drusilla Kannerhomas  Dalessio M.D.   On: 08/23/2017 16:28   Dg Femur Min 2 Views Left  Result Date: 08/23/2017 CLINICAL DATA:  Left upper leg pain due to a fall today. Initial encounter. EXAM: LEFT FEMUR 2 VIEWS COMPARISON:  None. FINDINGS: There is no evidence of fracture or other focal bone lesions. Left knee osteoarthritis is noted. Soft tissues are unremarkable. IMPRESSION: No acute abnormality. Osteoarthritis left knee. Electronically Signed   By: Drusilla Kannerhomas  Dalessio M.D.   On: 08/23/2017 16:29        Scheduled Meds: . acetaminophen  500 mg Oral Q6H  . aspirin EC  325 mg Oral Q breakfast  . atorvastatin  10 mg Oral Daily  .  calcium-vitamin D  1 tablet Oral Q breakfast  . docusate sodium  100 mg Oral BID  . ferrous sulfate  325 mg Oral BID WC  . hydrochlorothiazide  25 mg Oral Daily  . levothyroxine  125 mcg Oral QAC breakfast   Continuous Infusions: . sodium chloride 50 mL/hr at 08/23/17 2239  . methocarbamol (ROBAXIN)  IV       LOS: 1 day     Alwyn Ren, MD [facility]  If 7PM-7AM, please contact night-coverage [email or web address] Password [facility] 08/24/2017, 11:36 AM

## 2017-08-25 LAB — CBC
HEMATOCRIT: 33.7 % — AB (ref 36.0–46.0)
Hemoglobin: 11.2 g/dL — ABNORMAL LOW (ref 12.0–15.0)
MCH: 29.4 pg (ref 26.0–34.0)
MCHC: 33.2 g/dL (ref 30.0–36.0)
MCV: 88.5 fL (ref 78.0–100.0)
Platelets: 242 10*3/uL (ref 150–400)
RBC: 3.81 MIL/uL — ABNORMAL LOW (ref 3.87–5.11)
RDW: 13.9 % (ref 11.5–15.5)
WBC: 7.8 10*3/uL (ref 4.0–10.5)

## 2017-08-25 LAB — BASIC METABOLIC PANEL
Anion gap: 10 (ref 5–15)
BUN: 15 mg/dL (ref 6–20)
CHLORIDE: 101 mmol/L (ref 101–111)
CO2: 27 mmol/L (ref 22–32)
CREATININE: 0.92 mg/dL (ref 0.44–1.00)
Calcium: 8.6 mg/dL — ABNORMAL LOW (ref 8.9–10.3)
GFR calc Af Amer: >60 mL/min (ref >60)
GFR calc non Af Amer: 59 mL/min — ABNORMAL LOW (ref >60)
Glucose, Bld: 187 mg/dL — ABNORMAL HIGH (ref 65–99)
POTASSIUM: 3 mmol/L — AB (ref 3.5–5.1)
Sodium: 138 mmol/L (ref 135–145)

## 2017-08-25 NOTE — Clinical Social Work Note (Addendum)
Pasrr approved #[id number]  Clinical Social Work Assessment  Patient Details  Name: Terri Vasquez MRN: [id number] Date of Birth: 1940-06-20  Date of referral:  08/25/17               Reason for consult:  Facility Placement, Discharge Planning                Permission sought to share information with:  Family Supports Permission granted to share information::     Name::     [name]  Agency::  [facility]  Relationship::     Contact Information:     Housing/Transportation Living arrangements for the past 2 months:  [facility] Source of Information:  Adult Children Patient Interpreter Needed:  None Criminal Activity/Legal Involvement Pertinent to Current Situation/Hospitalization:  No - Comment as needed Significant Relationships:  Adult Children, Merchandiser, retailCommunity Support Lives with:  Facility Resident Do you feel safe going back to the place where you live?  Yes Need for family participation in patient care:  Yes (Comment)(pt with dementia, sons involved )  Care giving concerns:  Pt admitted from assisted living facility for hip fracture- now 2 days post op. At assisted living uses walker and ambulates without assistance. Has dementia and short term memory very impaired per son, and pt not typically oriented to situation.   Social Worker assessment / plan:  CSW consulted to assist with disposition. Pt lives at [facility] assisted [facility] since 11/2016. Son reports doing well there, however since sustaining fracture (had a fall), he reports she is further from baseline than expected following hip surgery. Are requesting SNF rehab for pt at [place] with plan of returning to ALF once rehab complete. Provided list of SNFs in area for review- son states he will call back with preferences. CSW completed FL2 and made referrals Pasrr not obtained- initiated pasrr request however  Must website not operating. Will follow up. Pt will need PASRR approved prior to admitting to a  SNF. [facility] to [facility] which can be initiated by SNF once a facility is confirmed.  Plan: SNF for short term rehab at [place] Barriers: PASRR, bed offers, [facility]  Employment status:  Retired Theme park managernsurance information:  Managed Medicare(UHC Medicare) PT Recommendations:  Home with Home Health, 24 Hour Supervision, Skilled Nursing Facility Information / Referral to community resources:  Skilled Nursing Facility  Patient/Family's Response to care:  appreciative  Patient/Family's Understanding of and Emotional Response to Diagnosis, Current Treatment, and Prognosis:  Son shows good understanding of treatment and was good historian of pt's care needs. Emotionally pleasant and appropriate- "I think she'll do great after some therapy"  Emotional Assessment Appearance:    Attitude/Demeanor/Rapport:  (UTA) Affect (typically observed):  (UTA) Orientation:  Oriented to Self Alcohol / Substance use:  Not Applicable Psych involvement (Current and /or in the community):  No (Comment)  Discharge Needs  Concerns to be addressed:  Discharge Planning Concerns Readmission within the last 30 days:  No Current discharge risk:  None Barriers to Discharge:  English as a second language teachernsurance Authorization, Awaiting State Approval ([place])   Terri SalisburyMeghan R Orian Figueira, LCSW 08/25/2017, 12:22 PM  [phone] weekend coverage for [phone]

## 2017-08-25 NOTE — Progress Notes (Signed)
PROGRESS NOTE    Terri Vasquez  YNW:[id number] DOB: [birth year] DOA: 08/23/2017 PCP: Renford DillsPolite, Ronald, MD  Brief [name] y.o.femalewith medical history significant ofhypothyroidism and varicose veins she was walking in her apartment slipped on the carpet and fell and has a right hip fracture. She denied any chest pain shortness of breath nausea vomiting lightheadedness dizziness changes with her vision headache abdominal pain or urinary complaints. Denied fever chills or cough. She was in her usual state of health she is a very pleasant young lady.    ED Course:ED physician spoke to Dr. Luiz BlareGraves who would like to take for surgery tonight.  Review of Systems: As per HPI otherwise all other systems reviewed and are negative  Ambulatory Status:She is ambulates without a walker. And independent with all her ADLs.     Assessment & Plan:   Principal Problem:   Fracture of femoral neck, right (HCC) Active Problems:   Memory loss   Essential hypertension   Hypothyroid   AKI (acute kidney injury) (HCC)  1]  right acute subcapital fracture of the hip status post pinning-recommend SNF.  Ortho following.  DVT prophylaxis with aspirin 325 twice a day for 4 weeks.  Discussed with Ortho.  2] hypertension stable on home medications.  3] hypothyroidism continue Synthroid.  4] constipation continue stool softeners.  5] AKI resolved.   DVT prophylaxis aspirin Code Status: Full code Family Communication: No family available Disposition Plan: To SNF when okay with Ortho  Consultants:  Ortho Procedures: Right hip pinning Antimicrobials none Subjective: Resting in bed in no acute distress  Objective: Vitals:   08/24/17 0941 08/24/17 1338 08/24/17 2234 08/25/17 0449  BP: 120/69 (!) 109/53 (!) 112/58 131/65  Pulse: 66 68 74 68  Resp:   13 18  Temp: 98.1 F (36.7 C) 98 F (36.7 C) 99 F (37.2 C) 98.4 F (36.9 C)  TempSrc: Oral Oral Oral Oral  SpO2: 95% 97% 94% 94%    Weight:      Height:        Intake/Output Summary (Last 24 hours) at 08/25/2017 0908 Last data filed at 08/25/2017 0845 Gross per 24 hour  Intake 1566.67 ml  Output 1400 ml  Net 166.67 ml   Filed Weights   08/23/17 1527  Weight: 77.1 kg (170 lb)    Examination:  General exam: Appears calm and comfortable  Respiratory system: Clear to auscultation. Respiratory effort normal. Cardiovascular system: S1 & S2 heard, RRR. No JVD, murmurs, rubs, gallops or clicks. No pedal edema. Gastrointestinal system: Abdomen is nondistended, soft and nontender. No organomegaly or masses felt. Normal bowel sounds heard. Central nervous system: Alert and oriented. No focal neurological deficits. Extremities: Dressing over the right hip noted Skin: No rashes, lesions or ulcers     Data Reviewed: I have personally reviewed following labs and imaging studies  CBC: Recent Labs  Lab 08/23/17 1706 08/24/17 0511  WBC 7.1 9.5  NEUTROABS 5.3  --   HGB 12.4 11.8*  HCT 37.0 34.9*  MCV 91.8 89.7  PLT 268 279   Basic Metabolic Panel: Recent Labs  Lab 08/23/17 1706 08/24/17 0511  NA 140 141  K 3.6 4.1  CL 102 102  CO2 28 28  GLUCOSE 108* 151*  BUN 18 15  CREATININE 1.09* 0.87  CALCIUM 9.1 8.9   GFR: Estimated Creatinine Clearance: 55.3 mL/min (by C-G formula based on SCr of 0.87 mg/dL). Liver Function Tests: Recent Labs  Lab 08/23/17 1706  AST 28  ALT 24  ALKPHOS 59  BILITOT 0.7  PROT 7.0  ALBUMIN 3.7   No results for input(s): LIPASE, AMYLASE in the last 168 hours. No results for input(s): AMMONIA in the last 168 hours. Coagulation Profile: No results for input(s): INR, PROTIME in the last 168 hours. Cardiac Enzymes: No results for input(s): CKTOTAL, CKMB, CKMBINDEX, TROPONINI in the last 168 hours. BNP (last 3 results) No results for input(s): PROBNP in the last 8760 hours. HbA1C: No results for input(s): HGBA1C in the last 72 hours. CBG: No results for input(s): GLUCAP  in the last 168 hours. Lipid Profile: No results for input(s): CHOL, HDL, LDLCALC, TRIG, CHOLHDL, LDLDIRECT in the last 72 hours. Thyroid Function Tests: No results for input(s): TSH, T4TOTAL, FREET4, T3FREE, THYROIDAB in the last 72 hours. Anemia Panel: No results for input(s): VITAMINB12, FOLATE, FERRITIN, TIBC, IRON, RETICCTPCT in the last 72 hours. Sepsis Labs: No results for input(s): PROCALCITON, LATICACIDVEN in the last 168 hours.  No results found for this or any previous visit (from the past 240 hour(s)).       Radiology Studies: Dg Chest 1 View  Result Date: 08/23/2017 CLINICAL DATA:  Preop.  Witnessed fall. EXAM: CHEST  1 VIEW COMPARISON:  Radiographs of June 27, 2010. FINDINGS: Stable cardiomediastinal silhouette. No pneumothorax or pleural effusion is noted. Both lungs are clear. The visualized skeletal structures are unremarkable. IMPRESSION: No acute cardiopulmonary abnormality seen. Electronically Signed   By: Lupita Raider, M.D.   On: 08/23/2017 17:14   Dg C-arm 1-60 Min-no Report  Result Date: 08/23/2017 Fluoroscopy was utilized by the requesting physician.  No radiographic interpretation.   Dg Hip Operative Unilat With Pelvis Right  Result Date: 08/23/2017 CLINICAL DATA:  Right hip pinning, right hip fracture EXAM: OPERATIVE right HIP (WITH PELVIS IF PERFORMED)  VIEWS TECHNIQUE: Fluoroscopic spot image(s) were submitted for interpretation post-operatively. COMPARISON:  Radiographs 08/23/2017 FINDINGS: Two low resolution images of the right hip. Total fluoroscopy time was 1 minutes 18 seconds. The images demonstrate 3 threaded pin fixation of the proximal right femur for femoral neck fracture. IMPRESSION: Intraoperative fluoroscopic assistance provided during surgical fixation of right femoral neck fracture Electronically Signed   By: Jasmine Pang M.D.   On: 08/23/2017 20:25   Dg Hip Unilat W Or Wo Pelvis 2-3 Views Right  Result Date: 08/23/2017 CLINICAL DATA:   Right hip pain due to a slip and fall today. Initial encounter. EXAM: DG HIP (WITH OR WITHOUT PELVIS) 2-3V RIGHT COMPARISON:  None. FINDINGS: The patient has an acute subcapital fracture of the right hip. No other acute abnormality is identified. No notable degenerative change about the hips. No lytic or sclerotic lesion. Soft tissues unremarkable. IMPRESSION: Acute subcapital fracture right hip. Electronically Signed   By: Drusilla Kanner M.D.   On: 08/23/2017 16:28   Dg Femur Min 2 Views Left  Result Date: 08/23/2017 CLINICAL DATA:  Left upper leg pain due to a fall today. Initial encounter. EXAM: LEFT FEMUR 2 VIEWS COMPARISON:  None. FINDINGS: There is no evidence of fracture or other focal bone lesions. Left knee osteoarthritis is noted. Soft tissues are unremarkable. IMPRESSION: No acute abnormality. Osteoarthritis left knee. Electronically Signed   By: Drusilla Kanner M.D.   On: 08/23/2017 16:29        Scheduled Meds: . aspirin EC  325 mg Oral BID  . atorvastatin  10 mg Oral Daily  . calcium-vitamin D  1 tablet Oral Q breakfast  . docusate sodium  100 mg Oral BID  .  ferrous sulfate  325 mg Oral BID WC  . hydrochlorothiazide  25 mg Oral Daily  . levothyroxine  125 mcg Oral QAC breakfast   Continuous Infusions: . sodium chloride 50 mL/hr at 08/23/17 2239  . methocarbamol (ROBAXIN)  IV       LOS: 2 days     Alwyn Ren, MD [facility] If 7PM-7AM, please contact night-coverage [email or web address] Password [facility] 08/25/2017, 9:08 AM

## 2017-08-25 NOTE — NC FL2 (Signed)
[place] MEDICAID FL2 LEVEL OF CARE SCREENING TOOL     IDENTIFICATION  Patient Name: Terri Vasquez Birthdate: 1940/09/27 Sex: female Admission Date (Current Location): 08/23/2017  [facility] and [place] Number:  Producer, television/film/video and Address:  [facility],  [address] [place], [place] [address]      Provider Number: [id number]  Attending Physician Name and Address:  Alwyn Ren, MD  Relative Name and Phone Number:       Current Level of Care: Hospital Recommended Level of Care: Skilled Nursing Facility Prior Approval Number:    Date Approved/Denied:   PASRR Number:    Discharge Plan: SNF    Current Diagnoses: Patient Active Problem List   Diagnosis Date Noted  . Essential hypertension 08/24/2017  . Hypothyroid 08/24/2017  . AKI (acute kidney injury) (HCC) 08/24/2017  . Hip fracture (HCC) 08/23/2017  . Fracture of femoral neck, right (HCC) 08/23/2017  . Memory loss 10/01/2016  . 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    Orientation RESPIRATION BLADDER Height & Weight     Self  Normal Incontinent(intermittently) Weight: 170 lb (77.1 kg) Height:  5\' 4"  (162.6 cm)  BEHAVIORAL SYMPTOMS/MOOD NEUROLOGICAL BOWEL NUTRITION STATUS      Continent Diet(regular diet)  AMBULATORY STATUS COMMUNICATION OF NEEDS Skin   Limited Assist   Surgical wounds(closed incision right hip)                       Personal Care Assistance Level of Assistance  Bathing, Feeding, Dressing Bathing Assistance: Limited assistance Feeding assistance: Independent Dressing Assistance: Limited assistance     Functional Limitations Info  Sight, Hearing, Speech Sight Info: Adequate Hearing Info: Adequate Speech Info: Adequate    SPECIAL CARE FACTORS FREQUENCY  PT (By licensed PT), OT (By licensed OT)     PT Frequency: 5x OT Frequency: 5x            Contractures Contractures Info: Not present    Additional Factors Info  Code  Status, Allergies Code Status Info: full code Allergies Info: nka           Current Medications (08/25/2017):  This is the current hospital active medication list Current Facility-Administered Medications  Medication Dose Route Frequency Provider Last Rate Last Dose  . 0.9 %  sodium chloride infusion   Intravenous Continuous Marshia Ly, PA-C 50 mL/hr at 08/23/17 2239    . acetaminophen (TYLENOL) tablet 325-650 mg  325-650 mg Oral Q6H PRN Marshia Ly, PA-C      . alum & mag hydroxide-simeth (MAALOX/MYLANTA) 200-200-20 MG/5ML suspension 30 mL  30 mL Oral Q4H PRN Marshia Ly, PA-C      . aspirin EC tablet 325 mg  325 mg Oral BID Alwyn Ren, MD   325 mg at 08/25/17 5784  . atorvastatin (LIPITOR) tablet 10 mg  10 mg Oral Daily Marshia Ly, PA-C   10 mg at 08/25/17 6962  . bisacodyl (DULCOLAX) EC tablet 5 mg  5 mg Oral Daily PRN Marshia Ly, PA-C      . calcium-vitamin D (OSCAL WITH D) 500-200 MG-UNIT per tablet 1 tablet  1 tablet Oral Q breakfast Alwyn Ren, MD   1 tablet at 08/25/17 [phone]  . docusate sodium (COLACE) capsule 100 mg  100 mg Oral BID Marshia Ly, PA-C   100 mg at 08/25/17 4132  . ferrous sulfate tablet 325 mg  325 mg Oral BID WC Alwyn Ren, MD  325 mg at 08/25/17 0724  . hydrALAZINE (APRESOLINE) injection 10 mg  10 mg Intravenous Q6H PRN Marshia LyBethune, James, PA-C      . hydrochlorothiazide (HYDRODIURIL) tablet 25 mg  25 mg Oral Daily Marshia LyBethune, James, PA-C   25 mg at 08/25/17 [id number]  . HYDROcodone-acetaminophen (NORCO/VICODIN) 5-325 MG per tablet 1-2 tablet  1-2 tablet Oral Q4H PRN Marshia LyBethune, James, PA-C   2 tablet at 08/24/17 2209  . HYDROmorphone (DILAUDID) injection 0.5 mg  0.5 mg Intravenous Q4H PRN Marshia LyBethune, James, PA-C      . levothyroxine (SYNTHROID, LEVOTHROID) tablet 125 mcg  125 mcg Oral QAC breakfast Marshia LyBethune, James, PA-C   125 mcg at 08/25/17 0724  . methocarbamol (ROBAXIN) tablet 500 mg  500 mg Oral Q6H PRN Marshia LyBethune, James, PA-C   500  mg at 08/24/17 1657   Or  . methocarbamol (ROBAXIN) 500 mg in dextrose 5 % 50 mL IVPB  500 mg Intravenous Q6H PRN Marshia LyBethune, James, PA-C      . metoCLOPramide (REGLAN) tablet 5-10 mg  5-10 mg Oral Q8H PRN Marshia LyBethune, James, PA-C       Or  . metoCLOPramide (REGLAN) injection 5-10 mg  5-10 mg Intravenous Q8H PRN Marshia LyBethune, James, PA-C      . ondansetron [facility](ZOFRAN) tablet 4 mg  4 mg Oral Q6H PRN Marshia LyBethune, James, PA-C       Or  . ondansetron (ZOFRAN) injection 4 mg  4 mg Intravenous Q6H PRN Marshia LyBethune, James, PA-C      . polyethylene glycol (MIRALAX / GLYCOLAX) packet 17 g  17 g Oral Daily PRN Marshia LyBethune, James, PA-C         Discharge Medications: Please see discharge summary for a list of discharge medications.  Relevant Imaging Results:  Relevant Lab Results:   Additional Information SS# [id number]  Nelwyn SalisburyMeghan R Kouper Spinella, LCSW

## 2017-08-25 NOTE — Progress Notes (Signed)
Subjective: 2 Days Post-Op Procedure(s) (LRB): CANNULATED HIP PINNING RIGHT (Right) Patient reports pain as mild.    Objective: Vital signs in last 24 hours: Temp:  [98 F (36.7 C)-99 F (37.2 C)] 98.4 F (36.9 C) (06/23 0449) Pulse Rate:  [68-74] 68 (06/23 0449) Resp:  [13-18] 18 (06/23 0449) BP: (109-131)/(53-65) 131/65 (06/23 0449) SpO2:  [94 %-97 %] 94 % (06/23 0449)  Intake/Output from previous day: 06/22 0701 - 06/23 0700 In: 1713.3 [P.O.:780; I.V.:800; IV Piggyback:133.3] Out: 1400 [Urine:1400] Intake/Output this shift: Total I/O In: 1019.2 [P.O.:240; I.V.:779.2] Out: -   Recent Labs    08/23/17 1706 08/24/17 0511 08/25/17 0928  HGB 12.4 11.8* 11.2*   Recent Labs    08/24/17 0511 08/25/17 0928  WBC 9.5 7.8  RBC 3.89 3.81*  HCT 34.9* 33.7*  PLT 279 242   Recent Labs    08/23/17 1706 08/24/17 0511  NA 140 141  K 3.6 4.1  CL 102 102  CO2 28 28  BUN 18 15  CREATININE 1.09* 0.87  GLUCOSE 108* 151*  CALCIUM 9.1 8.9   No results for input(s): LABPT, INR in the last 72 hours. Right hip exam: Neurovascular intact Sensation intact distally Intact pulses distally Dorsiflexion/Plantar flexion intact Incision: dressing C/D/I Compartment soft    Assessment/Plan: 2 Days Post-Op Procedure(s) (LRB): CANNULATED HIP PINNING RIGHT (Right)  Plan: Up with PT.  Weight-bear as tolerated on right. Aspirin 325 mg twice daily for DVT prophylaxis x1 month postop. Will probably need skilled nursing facility. Minimize pain meds so as not to cause confusion.    Matthew FolksJames G Warner [place] 08/25/2017, 10:11 AM

## 2017-08-26 ENCOUNTER — Encounter (HOSPITAL_COMMUNITY): Payer: Self-pay | Admitting: Orthopedic Surgery

## 2017-08-26 MED ORDER — METHOCARBAMOL 500 MG PO TABS: 500.0000 mg | ORAL_TABLET | Freq: Four times a day (QID) | ORAL | 0 refills | Status: DC | PRN

## 2017-08-26 MED ORDER — POTASSIUM CHLORIDE CRYS ER 20 MEQ PO TBCR
40.0000 meq | EXTENDED_RELEASE_TABLET | ORAL | Status: AC
Start: 2017-08-26 — End: 2017-08-26
  Administered 2017-08-26 (×2): 40 meq via ORAL
  Filled 2017-08-26 (×2): qty 2

## 2017-08-26 MED ORDER — DOCUSATE SODIUM 100 MG PO CAPS: 100.0000 mg | ORAL_CAPSULE | Freq: Two times a day (BID) | ORAL | 0 refills | Status: DC

## 2017-08-26 MED ORDER — FERROUS SULFATE 325 (65 FE) MG PO TABS: 325.0000 mg | ORAL_TABLET | Freq: Two times a day (BID) | ORAL | 3 refills | Status: DC

## 2017-08-26 MED ORDER — POLYETHYLENE GLYCOL 3350 17 G PO PACK: 17.0000 g | PACK | Freq: Every day | ORAL | 0 refills | Status: DC | PRN

## 2017-08-26 MED ORDER — TRAMADOL HCL 50 MG PO TABS: 50.0000 mg | ORAL_TABLET | Freq: Four times a day (QID) | ORAL | 0 refills | Status: AC | PRN

## 2017-08-26 MED ORDER — HYDROCODONE-ACETAMINOPHEN 5-325 MG PO TABS: 1.0000 | ORAL_TABLET | ORAL | 0 refills | Status: DC | PRN

## 2017-08-26 MED ORDER — ASPIRIN 325 MG PO TBEC: 325.0000 mg | DELAYED_RELEASE_TABLET | Freq: Two times a day (BID) | ORAL | 0 refills | Status: DC

## 2017-08-26 MED ORDER — ACETAMINOPHEN 325 MG PO TABS: 325.0000 mg | ORAL_TABLET | Freq: Four times a day (QID) | ORAL | Status: AC | PRN

## 2017-08-26 NOTE — Plan of Care (Signed)
  Problem: Education: Goal: Knowledge of General Education information will improve Outcome: Progressing   Problem: Activity: Goal: Risk for activity intolerance will decrease Outcome: Progressing   Problem: Coping: Goal: Level of anxiety will decrease Outcome: Progressing   Problem: Pain Managment: Goal: General experience of comfort will improve Outcome: Progressing   Problem: Safety: Goal: Ability to remain free from injury will improve Outcome: Progressing

## 2017-08-26 NOTE — Discharge Summary (Signed)
Physician Discharge Summary  Terri Vasquez ZOX:[id number] DOB: 10-14-1940 DOA: 08/23/2017  PCP: Renford Dills, MD  Admit date: 08/23/2017 Discharge date: 08/26/2017  Admitted From: Independent living Disposition: Skilled nursing facility Recommendations for Outpatient Follow-up:  1. Follow up with PCP in 1-2 weeks 2. Please obtain BMP/CBC in one week 3. Follow up with ortho dr graves  Home Health none Equipment/Devices: None  Discharge Condition: Stable CODE STATUS full code Diet recommendation: Cardiac Brief/Interim Summary:77 y.o.femalewith medical history significant ofhypothyroidism and varicose veins she was walking in her apartment slipped on the carpet and fell and has a right hip fracture. She denied any chest pain shortness of breath nausea vomiting lightheadedness dizziness changes with her vision headache abdominal pain or urinary complaints. Denied fever chills or cough. She was in her usual state of health she is a very pleasant young lady.    ED Course:ED physician spoke to Dr. Luiz Blare who would like to take for surgery tonight.  Review of Systems: As per HPI otherwise all other systems reviewed and are negative  Ambulatory Status:She is ambulates without a walker. And independent with all her ADLs.     Discharge Diagnoses:  Principal Problem:   Fracture of femoral neck, right (HCC) Active Problems:   Memory loss   Essential hypertension   Hypothyroid   AKI (acute kidney injury) (HCC)  1]  right acute subcapital fracture of the hip status post pinning-recommend SNF. DVT prophylaxis with aspirin 325 twice a day for 4 weeks.  He is weightbearing as tolerated to the right hip.  Patient will follow-up with Ortho Dr Luiz Blare.  2] hypertension patient was on HCTZ prior to admission to hospital.  Her blood pressure has been soft so HCTZ will be stopped prior to discharge.  Please restart as needed if the blood pressure goes up.  3] hypothyroidism continue  Synthroid.  4] constipation continue stool softeners.  5] AKI resolved.  6] mild hypokalemia replete K of 3.4 repleted.     Discharge Instructions  Discharge Instructions    Call MD for:  difficulty breathing, headache or visual disturbances   Complete by:  As directed    Call MD for:  persistant dizziness or light-headedness   Complete by:  As directed    Call MD for:  persistant nausea and vomiting   Complete by:  As directed    Call MD for:  redness, tenderness, or signs of infection (pain, swelling, redness, odor or green/yellow discharge around incision site)   Complete by:  As directed    Call MD for:  severe uncontrolled pain   Complete by:  As directed    Call MD for:  temperature >100.4   Complete by:  As directed    Diet - low sodium heart healthy   Complete by:  As directed    Increase activity slowly   Complete by:  As directed    Weight bearing as tolerated   Complete by:  As directed    Laterality:  right   Extremity:  Lower     Allergies as of 08/26/2017   No Known Allergies     Medication List    STOP taking these medications   hydrochlorothiazide 25 MG tablet Commonly known as:  HYDRODIURIL     TAKE these medications   acetaminophen 325 MG tablet Commonly known as:  TYLENOL Take 1-2 tablets (325-650 mg total) by mouth every 6 (six) hours as needed for mild pain (pain score 1-3 or temp > 100.5).  aspirin 325 MG EC tablet Take 1 tablet (325 mg total) by mouth 2 (two) times daily.   atorvastatin 10 MG tablet Commonly known as:  LIPITOR Take 10 mg by mouth daily.   CALCIUM 500/D 500-400 MG-UNIT Chew Generic drug:  Calcium Carb-Cholecalciferol Chew 1 tablet by mouth daily.   docusate sodium 100 MG capsule Commonly known as:  COLACE Take 1 capsule (100 mg total) by mouth 2 (two) times daily.   ferrous sulfate 325 (65 FE) MG EC tablet Take 325 mg by mouth 2 (two) times daily.   levothyroxine 125 MCG tablet Commonly known as:   SYNTHROID, LEVOTHROID Take 125 mcg by mouth daily before breakfast.   Melatonin 3 MG Tabs Take 1 tablet by mouth daily.   methocarbamol 500 MG tablet Commonly known as:  ROBAXIN Take 1 tablet (500 mg total) by mouth every 6 (six) hours as needed for muscle spasms.   multivitamin-lutein Caps capsule Take 1 capsule by mouth daily.   polyethylene glycol packet Commonly known as:  MIRALAX / GLYCOLAX Take 17 g by mouth daily as needed for mild constipation.   traMADol 50 MG tablet Commonly known as:  ULTRAM Take 1 tablet (50 mg total) by mouth every 6 (six) hours as needed for up to 7 days.            Discharge Care Instructions  (From admission, onward)        Start     Ordered   08/23/17 0000  Weight bearing as tolerated    Question Answer Comment  Laterality right   Extremity Lower      08/23/17 2025      Contact information for follow-up providers    Jodi GeraldsGraves, John, MD. Schedule an appointment as soon as possible for a visit in 2 weeks.   Specialty:  Orthopedic Surgery Contact information: [address] [place] [address] [phone]            Contact information for after-discharge care    Destination    [facility] .   Service:  Skilled Nursing Contact information: [address] [place] [id number] [phone]                 No Known Allergies  Consultations ortho   Procedures/Studies: Dg Chest 1 View  Result Date: 08/23/2017 CLINICAL DATA:  Preop.  Witnessed fall. EXAM: CHEST  1 VIEW COMPARISON:  Radiographs of June 27, 2010. FINDINGS: Stable cardiomediastinal silhouette. No pneumothorax or pleural effusion is noted. Both lungs are clear. The visualized skeletal structures are unremarkable. IMPRESSION: No acute cardiopulmonary abnormality seen. Electronically Signed   By: Lupita RaiderJames  Green Jr, M.D.   On: 08/23/2017 17:14   Dg C-arm 1-60 Min-no Report  Result Date: 08/23/2017 Fluoroscopy was utilized  by the requesting physician.  No radiographic interpretation.   Dg Hip Operative Unilat With Pelvis Right  Result Date: 08/23/2017 CLINICAL DATA:  Right hip pinning, right hip fracture EXAM: OPERATIVE right HIP (WITH PELVIS IF PERFORMED)  VIEWS TECHNIQUE: Fluoroscopic spot image(s) were submitted for interpretation post-operatively. COMPARISON:  Radiographs 08/23/2017 FINDINGS: Two low resolution images of the right hip. Total fluoroscopy time was 1 minutes 18 seconds. The images demonstrate 3 threaded pin fixation of the proximal right femur for femoral neck fracture. IMPRESSION: Intraoperative fluoroscopic assistance provided during surgical fixation of right femoral neck fracture Electronically Signed   By: Jasmine PangKim  Fujinaga M.D.   On: 08/23/2017 20:25   Dg Hip Unilat W Or Wo Pelvis  2-3 Views Right  Result Date: 08/23/2017 CLINICAL DATA:  Right hip pain due to a slip and fall today. Initial encounter. EXAM: DG HIP (WITH OR WITHOUT PELVIS) 2-3V RIGHT COMPARISON:  None. FINDINGS: The patient has an acute subcapital fracture of the right hip. No other acute abnormality is identified. No notable degenerative change about the hips. No lytic or sclerotic lesion. Soft tissues unremarkable. IMPRESSION: Acute subcapital fracture right hip. Electronically Signed   By: Drusilla Kanner M.D.   On: 08/23/2017 16:28   Dg Femur Min 2 Views Left  Result Date: 08/23/2017 CLINICAL DATA:  Left upper leg pain due to a fall today. Initial encounter. EXAM: LEFT FEMUR 2 VIEWS COMPARISON:  None. FINDINGS: There is no evidence of fracture or other focal bone lesions. Left knee osteoarthritis is noted. Soft tissues are unremarkable. IMPRESSION: No acute abnormality. Osteoarthritis left knee. Electronically Signed   By: Drusilla Kanner M.D.   On: 08/23/2017 16:29    (Echo, Carotid, EGD, Colonoscopy, ERCP)    Subjective:   Discharge Exam: Vitals:   08/26/17 0538 08/26/17 0848  BP: 124/83 126/72  Pulse: 71 71  Resp: 14  18  Temp: 98.6 F (37 C) 98.4 F (36.9 C)  SpO2: 96% 95%   Vitals:   08/25/17 1522 08/25/17 2219 08/26/17 0538 08/26/17 0848  BP: 96/82 (!) 141/60 124/83 126/72  Pulse: 73 64 71 71  Resp: 16 15 14 18   Temp: 98 F (36.7 C) 98.1 F (36.7 C) 98.6 F (37 C) 98.4 F (36.9 C)  TempSrc: Oral Oral Oral Oral  SpO2: 96% 100% 96% 95%  Weight:      Height:        General: Pt is alert, awake, not in acute distress Cardiovascular: RRR, S1/S2 +, no rubs, no gallops Respiratory: CTA bilaterally, no wheezing, no rhonchi Abdominal: Soft, NT, ND, bowel sounds + Extremities: left hip dressings on.  The results of significant diagnostics from this hospitalization (including imaging, microbiology, ancillary and laboratory) are listed below for reference.     Microbiology: No results found for this or any previous visit (from the past 240 hour(s)).   Labs: BNP (last 3 results) No results for input(s): BNP in the last 8760 hours. Basic Metabolic Panel: Recent Labs  Lab 08/23/17 1706 08/24/17 0511 08/25/17 0928  NA 140 141 138  K 3.6 4.1 3.0*  CL 102 102 101  CO2 28 28 27   GLUCOSE 108* 151* 187*  BUN 18 15 15   CREATININE 1.09* 0.87 0.92  CALCIUM 9.1 8.9 8.6*   Liver Function Tests: Recent Labs  Lab 08/23/17 1706  AST 28  ALT 24  ALKPHOS 59  BILITOT 0.7  PROT 7.0  ALBUMIN 3.7   No results for input(s): LIPASE, AMYLASE in the last 168 hours. No results for input(s): AMMONIA in the last 168 hours. CBC: Recent Labs  Lab 08/23/17 1706 08/24/17 0511 08/25/17 0928  WBC 7.1 9.5 7.8  NEUTROABS 5.3  --   --   HGB 12.4 11.8* 11.2*  HCT 37.0 34.9* 33.7*  MCV 91.8 89.7 88.5  PLT 268 279 242   Cardiac Enzymes: No results for input(s): CKTOTAL, CKMB, CKMBINDEX, TROPONINI in the last 168 hours. BNP: Invalid input(s): POCBNP CBG: No results for input(s): GLUCAP in the last 168 hours. D-Dimer No results for input(s): DDIMER in the last 72 hours. Hgb A1c No results for  input(s): HGBA1C in the last 72 hours. Lipid Profile No results for input(s): CHOL, HDL, LDLCALC, TRIG, CHOLHDL, LDLDIRECT  in the last 72 hours. Thyroid function studies No results for input(s): TSH, T4TOTAL, T3FREE, THYROIDAB in the last 72 hours.  Invalid input(s): FREET3 Anemia work up No results for input(s): VITAMINB12, FOLATE, FERRITIN, TIBC, IRON, RETICCTPCT in the last 72 hours. Urinalysis    Component Value Date/Time   COLORURINE YELLOW 08/23/2017 1743   APPEARANCEUR CLEAR 08/23/2017 1743   APPEARANCEUR Clear 09/26/2016 0625   LABSPEC 1.008 08/23/2017 1743   PHURINE 7.0 08/23/2017 1743   GLUCOSEU NEGATIVE 08/23/2017 1743   HGBUR NEGATIVE 08/23/2017 1743   BILIRUBINUR NEGATIVE 08/23/2017 1743   BILIRUBINUR Negative 09/26/2016 0625   KETONESUR NEGATIVE 08/23/2017 1743   PROTEINUR NEGATIVE 08/23/2017 1743   NITRITE NEGATIVE 08/23/2017 1743   LEUKOCYTESUR NEGATIVE 08/23/2017 1743   LEUKOCYTESUR Negative 09/26/2016 0625   Sepsis Labs Invalid input(s): PROCALCITONIN,  WBC,  LACTICIDVEN Microbiology No results found for this or any previous visit (from the past 240 hour(s)).   Time coordinating discharge: 33 minutes  SIGNED:   Alwyn Ren, MD  [facility] 08/26/2017, 11:36 AM Pager   If 7PM-7AM, please contact night-coverage [email or web address] Password [email or web address]

## 2017-08-26 NOTE — Progress Notes (Signed)
Subjective: 3 Days Post-Op Procedure(s) (LRB): CANNULATED HIP PINNING RIGHT (Right) Patient reports pain as mild.    Objective: Vital signs in last 24 hours: Temp:  [98 F (36.7 C)-98.6 F (37 C)] 98.6 F (37 C) (06/24 0538) Pulse Rate:  [64-73] 71 (06/24 0538) Resp:  [14-16] 14 (06/24 0538) BP: (96-141)/(60-83) 124/83 (06/24 0538) SpO2:  [96 %-100 %] 96 % (06/24 0538)  Intake/Output from previous day: 06/23 0701 - 06/24 0700 In: 1927.5 [P.O.:720; I.V.:1207.5] Out: -  Intake/Output this shift: No intake/output data recorded.  Recent Labs    08/23/17 1706 08/24/17 0511 08/25/17 0928  HGB 12.4 11.8* 11.2*   Recent Labs    08/24/17 0511 08/25/17 0928  WBC 9.5 7.8  RBC 3.89 3.81*  HCT 34.9* 33.7*  PLT 279 242   Recent Labs    08/24/17 0511 08/25/17 0928  NA 141 138  K 4.1 3.0*  CL 102 101  CO2 28 27  BUN 15 15  CREATININE 0.87 0.92  GLUCOSE 151* 187*  CALCIUM 8.9 8.6*   No results for input(s): LABPT, INR in the last 72 hours. Right hip exam: Neurovascular intact Sensation intact distally Intact pulses distally Dorsiflexion/Plantar flexion intact Incision: dressing C/D/I Compartment soft  Assessment/Plan: 3 Days Post-Op Procedure(s) (LRB): CANNULATED HIP PINNING RIGHT (Right)  Plan: Aspirin 325 mg twice daily x1 month postop for DVT prophylaxis. Up with physical therapy weightbearing as tolerated on right hip. To skilled nursing facility soon.    Terri FolksJames G Liylah Vasquez 08/26/2017, 8:19 AM

## 2017-08-26 NOTE — Progress Notes (Signed)
Patient ambulating to bathroom with walker. Pain of 6 controlled with PRN Norco. Vitals stable.

## 2017-08-26 NOTE — Clinical Social Work Placement (Signed)
   CLINICAL SOCIAL WORK PLACEMENT  NOTE  Date:  08/26/2017  Patient Details  Name: Terri Vasquez MRN: [id number] Date of Birth: [birth year]  Clinical Social Work is seeking post-discharge placement for this patient at the Skilled  Nursing Facility level of care (*CSW will initial, date and re-position this form in  chart as items are completed):  Yes   Patient/family provided with [facility] Clinical Social Work Department's list of facilities offering this level of care within the geographic area requested by the patient (or if unable, by the patient's family).  Yes   Patient/family informed of their freedom to choose among providers that offer the needed level of care, that participate in Medicare, Medicaid or managed care program needed by the patient, have an available bed and are willing to accept the patient.  Yes   Patient/family informed of [facility]'s ownership interest in [facility] and [facility], as well as of the fact that they are under no obligation to receive care at these facilities.  PASRR submitted to EDS on       PASRR number received on       Existing PASRR number confirmed on 08/26/[phone])     FL2 transmitted to all facilities in geographic area requested by pt/family on       FL2 transmitted to all facilities within larger geographic area on 08/26/17     Patient informed that his/her managed care company has contracts with or will negotiate with certain facilities, including the following:  [facility]     Yes   Patient/family informed of bed offers received.  [facility]     Physician recommends and patient chooses bed at      Patient to be [facility] to [facility] on 08/26/17.  Patient to be transferred to facility by [facility]     Patient family notified on 08/26/17 of transfer.  Name of family member notified:  Son-Travis     PHYSICIAN Please prepare priority discharge summary, including  medications     Additional Comment:    _______________________________________________ Clearance CootsNicole A Athol Bolds, LCSW 08/26/2017, 11:03 AM

## 2017-08-26 NOTE — Progress Notes (Addendum)
Physical Therapy Treatment Patient Details Name: Terri Vasquez MRN: [id number] DOB: [birth year] Today's Date: 08/26/2017    History of Present Illness 77 yo female with History of dementia from [facility], admitted 08/23/17 after a fall and sustained impacted femoral neck on the  right. S/P  CANNULATED HIP PINNING RIGHT (Right    PT Comments    Assisted patient to bathroom and ambulated in hallway. Patient needed reminders on location of nurse call button and to keep hands on walker. [facility].  Follow Up Recommendations  SNF     Equipment Recommendations       Recommendations for Other Services       Precautions / Restrictions Precautions Precautions: Fall Precaution Comments: dimished memory and safety cognition    Mobility  Bed Mobility                  Transfers Overall transfer level: Needs assistance Equipment used: Rolling walker (2 wheeled) Transfers: Sit to/from Stand Sit to Stand: Min assist         General transfer comment: 50% VC's for hand placement  Ambulation/Gait Ambulation/Gait assistance: Min assist Gait Distance (Feet): 200 Feet(2x standing rest break due to distractions) Assistive device: Rolling walker (2 wheeled) Gait Pattern/deviations: Step-to pattern;Step-through pattern;Decreased stride length;Decreased step length - right;Decreased step length - left;Decreased stance time - right Gait velocity: Decreased   General Gait Details: Step-to pattern for first 100 feet then transitioned to step-through pattern for second 100 feet.   Stairs             Wheelchair Mobility    Modified Rankin (Stroke Patients Only)       Balance                                            Cognition Arousal/Alertness: Awake/alert Behavior During Therapy: WFL for tasks assessed/performed Overall Cognitive Status: History of cognitive impairments - at baseline                                 General Comments: Plesantly confused; needed reminders for safety      Exercises      General Comments        Pertinent Vitals/Pain Pain Assessment: Faces Faces Pain Scale: Hurts a little bit Pain Location: right hip Pain Descriptors / Indicators: Grimacing Pain Intervention(s): Repositioned;Ice applied;Monitored during session    Home Living                      Prior Function            PT Goals (current goals can now be found in the care plan section) Progress towards PT goals: Progressing toward goals    Frequency    Min 2X/week      PT Plan Current plan remains appropriate    Co-evaluation              AM-PAC PT "6 Clicks" Daily Activity  Outcome Measure  Difficulty turning over in bed (including adjusting bedclothes, sheets and blankets)?: A Lot Difficulty moving from lying on back to sitting on the side of the bed? : A Lot Difficulty sitting down on and standing up from a chair with arms (e.g., wheelchair, bedside commode, etc,.)?: A Lot Help needed moving to  and from a bed to chair (including a wheelchair)?: A Lot Help needed walking in hospital room?: A Lot Help needed climbing 3-5 steps with a railing? : Total 6 Click Score: 11    End of Session Equipment Utilized During Treatment: Gait belt Activity Tolerance: Patient tolerated treatment well Patient left: in chair;with call bell/phone within reach;with chair alarm set   PT Visit Diagnosis: Unsteadiness on feet (R26.81);Pain Pain - Right/Left: Right Pain - part of body: Hip     Time: 1610-9604 PT Time Calculation (min) (ACUTE ONLY): 25 min  Charges:  $Gait Training: 8-22 mins $Therapeutic Activity: 8-22 mins                    G Codes:          Randa Lynn, SPTA 08/26/2017, 2:03 PM  Direct supervision during session and agree with above documantation Felecia Shelling  PTA [facility]  Acute  Rehab Pager      [phone]

## 2017-08-26 NOTE — Care Management Important Message (Signed)
Important Message  Patient Details  Name: Terri Vasquez Wegmann MRN: [id number] Date of Birth: [birth year]   Medicare Important Message Given:  Yes    Caren MacadamFuller, Maclean Foister 08/26/2017, 12:51 PMImportant Message  Patient Details  Name: Terri Vasquez Swigart MRN: [id number] Date of Birth: [birth year]   Medicare Important Message Given:  Yes    Caren MacadamFuller, Elie Gragert 08/26/2017, 12:51 PM

## 2017-10-01 ENCOUNTER — Non-Acute Institutional Stay: Payer: Medicare Other | Admitting: Nurse Practitioner

## 2017-10-01 ENCOUNTER — Encounter: Payer: Self-pay | Admitting: Nurse Practitioner

## 2017-10-01 VITALS — BP 150/80 | HR 70 | Resp 18

## 2017-10-01 DIAGNOSIS — Z8781 Personal history of (healed) traumatic fracture: Secondary | ICD-10-CM

## 2017-10-01 DIAGNOSIS — F0390 Unspecified dementia without behavioral disturbance: Secondary | ICD-10-CM

## 2017-10-01 DIAGNOSIS — Z515 Encounter for palliative care: Secondary | ICD-10-CM

## 2017-10-02 ENCOUNTER — Encounter: Payer: Self-pay | Admitting: Nurse Practitioner

## 2017-10-02 NOTE — Consult Note (Addendum)
PALLIATIVE CARE CONSULT VISIT   PATIENT NAME: Terri Vasquez DOB: [birth year] MRN: [id number]  PRIMARY CARE PROVIDER:   Renford DillsPolite, Ronald, MD  REFERRING PROVIDER:  Renford DillsPolite, Ronald, MD [address] [place] [address]  RESPONSIBLE PARTY:   Adine Maduraravis Cajas (son) [phone]   Impression and recommendations:  Dementia without behavioral disturbance -staff reports worsening of cognition since hospitalization for right hip fracture -thought process is disorganized and circumstantial -oral intake is good; weight is stable -continue supportive Care  Right Hip fracture s/p pinning -Followed by Dr. Luiz BlareGraves (ortho) -Receiving PT; is able to ambulate with walker; PT says she is walking further since initiation of scheduled tramadol  -HTN -Hypothyroidism -controlled-continue current regimen -continue synthroid  ACP -DNR on chart   I spent 45 minutes providing this consultation,  from 10:00 to 10:45. More than 50% of the time in this consultation was spent coordinating communication.   HISTORY OF PRESENT ILLNESS:  Terri Burorma B Sawatzky is a 77 y.o. year old female with multiple medical problems including recent right hip fracture s/p pinning,dementia, HTN, hypothyroidism, . Palliative Care was asked to assist with coordination of care, symptom management and ongoing support .   CODE STATUS: DNR  PPS: 60%  FAST 6B HOSPICE ELIGIBILITY/DIAGNOSIS: TBD  PAST MEDICAL HISTORY:  Past Medical History:  Diagnosis Date  . AKI (acute kidney injury) (HCC) 08/24/2017  . Essential hypertension 08/24/2017  . Family history of adverse reaction to anesthesia    sons have difficulty waking up  . Hypertension   . Hypothyroid 08/24/2017  . Hypothyroidism   . Thyroid disease   . Varicose veins     SOCIAL HX:  Social History   Tobacco Use  . Smoking status: Former Smoker    Types: Cigarettes    Last attempt to quit: 07/14/1996    Years since quitting: 21.2  . Smokeless tobacco: Never Used    Substance Use Topics  . Alcohol use: No    ALLERGIES: No Known Allergies   PERTINENT MEDICATIONS:  Outpatient Encounter Medications as of 10/01/2017  Medication Sig  . acetaminophen (TYLENOL) 325 MG tablet Take 1-2 tablets (325-650 mg total) by mouth every 6 (six) hours as needed for mild pain (pain score 1-3 or temp > 100.5).  Marland Kitchen. aspirin EC 325 MG EC tablet Take 1 tablet (325 mg total) by mouth 2 (two) times daily.  Marland Kitchen. atorvastatin (LIPITOR) 10 MG tablet Take 10 mg by mouth daily.   . Calcium Carb-Cholecalciferol (CALCIUM 500/D) 500-400 MG-UNIT CHEW Chew 1 tablet by mouth daily.  Marland Kitchen. docusate sodium (COLACE) 100 MG capsule Take 1 capsule (100 mg total) by mouth 2 (two) times daily.  . ferrous sulfate 325 (65 FE) MG EC tablet Take 325 mg by mouth 2 (two) times daily.  Marland Kitchen. levothyroxine (SYNTHROID, LEVOTHROID) 125 MCG tablet Take 125 mcg by mouth daily before breakfast.   . Melatonin 3 MG TABS Take 1 tablet by mouth daily.  . methocarbamol (ROBAXIN) 500 MG tablet Take 1 tablet (500 mg total) by mouth every 6 (six) hours as needed for muscle spasms.  . multivitamin-lutein (OCUVITE-LUTEIN) CAPS capsule Take 1 capsule by mouth daily.  . polyethylene glycol (MIRALAX / GLYCOLAX) packet Take 17 g by mouth daily as needed for mild constipation.   No facility-administered encounter medications on file as of 10/01/2017.     PHYSICAL EXAM:   General: NAD, Well developed, well nourished  Cardiovascular: regular rate and rhythm Pulmonary: clear ant fields Abdomen: soft, nontender, +  bowel sounds GU: no suprapubic tenderness Extremities: 1+ edema, no joint deformities Skin: no rashes Neurological: non-focal; thought process disorganized and circumstantial  Caytlin Better G [place], NP

## 2017-10-16 ENCOUNTER — Other Ambulatory Visit: Payer: Self-pay | Admitting: Orthopedic Surgery

## 2017-10-16 DIAGNOSIS — S72001A Fracture of unspecified part of neck of right femur, initial encounter for closed fracture: Secondary | ICD-10-CM

## 2017-10-24 ENCOUNTER — Ambulatory Visit
Admission: RE | Admit: 2017-10-24 | Discharge: 2017-10-24 | Disposition: A | Discharge status: Home / Self Care | Payer: Medicare Other | Source: Ambulatory Visit | Attending: Orthopedic Surgery | Admitting: Orthopedic Surgery

## 2017-10-24 DIAGNOSIS — S72001A Fracture of unspecified part of neck of right femur, initial encounter for closed fracture: Secondary | ICD-10-CM

## 2017-11-07 ENCOUNTER — Encounter

## 2017-11-07 ENCOUNTER — Ambulatory Visit: Payer: Medicare Other | Admitting: Neurology

## 2017-11-10 ENCOUNTER — Encounter: Payer: Self-pay | Admitting: Nurse Practitioner

## 2017-11-20 ENCOUNTER — Other Ambulatory Visit: Payer: Self-pay | Admitting: Orthopedic Surgery

## 2017-11-20 ENCOUNTER — Emergency Department (HOSPITAL_COMMUNITY)
Admission: EM | Admit: 2017-11-20 | Discharge: 2017-11-21 | Disposition: A | Discharge status: Home / Self Care | Payer: Medicare Other | Attending: Emergency Medicine | Admitting: Emergency Medicine

## 2017-11-20 ENCOUNTER — Emergency Department (HOSPITAL_COMMUNITY): Payer: Medicare Other

## 2017-11-20 ENCOUNTER — Encounter (HOSPITAL_COMMUNITY): Payer: Self-pay | Admitting: Emergency Medicine

## 2017-11-20 ENCOUNTER — Other Ambulatory Visit: Payer: Self-pay

## 2017-11-20 DIAGNOSIS — W19XXXA Unspecified fall, initial encounter: Secondary | ICD-10-CM

## 2017-11-20 DIAGNOSIS — Z79899 Other long term (current) drug therapy: Secondary | ICD-10-CM | POA: Diagnosis not present

## 2017-11-20 DIAGNOSIS — E039 Hypothyroidism, unspecified: Secondary | ICD-10-CM | POA: Diagnosis not present

## 2017-11-20 DIAGNOSIS — Y939 Activity, unspecified: Secondary | ICD-10-CM | POA: Insufficient documentation

## 2017-11-20 DIAGNOSIS — S79911A Unspecified injury of right hip, initial encounter: Secondary | ICD-10-CM | POA: Insufficient documentation

## 2017-11-20 DIAGNOSIS — I10 Essential (primary) hypertension: Secondary | ICD-10-CM | POA: Insufficient documentation

## 2017-11-20 DIAGNOSIS — F0390 Unspecified dementia without behavioral disturbance: Secondary | ICD-10-CM | POA: Insufficient documentation

## 2017-11-20 DIAGNOSIS — Y999 Unspecified external cause status: Secondary | ICD-10-CM | POA: Insufficient documentation

## 2017-11-20 DIAGNOSIS — Y92129 Unspecified place in nursing home as the place of occurrence of the external cause: Secondary | ICD-10-CM | POA: Diagnosis not present

## 2017-11-20 DIAGNOSIS — Z87891 Personal history of nicotine dependence: Secondary | ICD-10-CM | POA: Insufficient documentation

## 2017-11-20 DIAGNOSIS — S7001XA Contusion of right hip, initial encounter: Secondary | ICD-10-CM

## 2017-11-20 DIAGNOSIS — Z7982 Long term (current) use of aspirin: Secondary | ICD-10-CM | POA: Diagnosis not present

## 2017-11-20 DIAGNOSIS — W010XXA Fall on same level from slipping, tripping and stumbling without subsequent striking against object, initial encounter: Secondary | ICD-10-CM | POA: Insufficient documentation

## 2017-11-20 MED ORDER — FENTANYL CITRATE (PF) 100 MCG/2ML IJ SOLN
50.0000 ug | INTRAMUSCULAR | Status: DC | PRN
  Administered 2017-11-20: 50 ug via INTRAVENOUS
  Filled 2017-11-20: qty 2

## 2017-11-20 NOTE — ED Triage Notes (Signed)
Patient experienced an unwitnessed fall when getting up from a sitting position. She complains of bilateral pain from the waist down. She also has general pain when touched.

## 2017-11-20 NOTE — ED Notes (Signed)
Bed: WA21 Expected date:  Expected time:  Means of arrival:  Comments: 77 yr old female, fall hip pain

## 2017-11-20 NOTE — ED Notes (Addendum)
Pt's son out to desk asking for pain medicine for pt Pt's son told that the EDPs have not signed for pt just yet but they were making their rounds and getting to all the new pt's in the ED Pt's son helped to understand that the RN's aren't allowed to give pain meds w/o an EDPs order Pt's son informed that we would monitor for orders of pain medications Pt's son stated that he understood and returned to rm  Xray went to rm to get pt for xray and stated that pt wasn't able to get Xray because she was in too much pain Xray stated that the "pt"s son was very upset that staff was sitting at the desk eating popcorn and talking about their weekend." Writer returned to pt's room and attempted to apologize for what he perceived and again explain the situation Again tried to explain to him that RNs are only allowed to do so much and that the EDP has been notified Also tried to explain that it is also frustrating for staff when we want to do more for our pt's but cannot. Pt's son arguing with me and not allowing me to speak. Pt's son was argumentative and did not want to listen Writer walked out of rm RN notified

## 2017-11-21 NOTE — ED Notes (Signed)
Report called to [facility]  

## 2017-11-21 NOTE — ED Notes (Addendum)
Pt. Documented in error DG HIPS BILAT WITH PELVIS 3-4 VIEWS.

## 2017-11-21 NOTE — Discharge Instructions (Addendum)
Continue medications as before.  Follow-up with your orthopedist in the next week, and return to the ER if symptoms significantly worsen or change.

## 2017-11-21 NOTE — ED Notes (Signed)
Patient ambulated 6530ft with walker. Tolerated well.

## 2017-11-21 NOTE — ED Provider Notes (Signed)
[facility]-EMERGENCY DEPT Provider Note   CSN: [id number] Arrival date & time: 11/20/17  2119     History   Chief Complaint Chief Complaint  Patient presents with  . Fall    HPI Terri Vasquez is a 77 y.o. female.  Patient is a 77 year old female with history of dementia, hypertension, and fall several months ago with right hip fracture requiring ORIF.  This was performed by Dr. Luiz Blare.  Since the surgery, there has been some slippage of the hardware in his caused shortening of the femoral neck.  This evening, while at assisted living, the patient fell and injured the same hip.  She was unable to stand and walk and EMS was called.  Patient offers little additional history secondary to dementia.  History taken primarily from the son who was present at bedside.  The history is provided by the patient.  Fall  This is a new problem. The current episode started 1 to 2 hours ago. The problem occurs constantly. The problem has not changed since onset.The symptoms are aggravated by walking. Nothing relieves the symptoms. She has tried nothing for the symptoms.    Past Medical History:  Diagnosis Date  . AKI (acute kidney injury) (HCC) 08/24/2017  . Essential hypertension 08/24/2017  . Family history of adverse reaction to anesthesia    sons have difficulty waking up  . Hypertension   . Hypothyroid 08/24/2017  . Hypothyroidism   . Thyroid disease   . Varicose veins     Patient Active Problem List   Diagnosis Date Noted  . Essential hypertension 08/24/2017  . Hypothyroid 08/24/2017  . AKI (acute kidney injury) (HCC) 08/24/2017  . Hip fracture (HCC) 08/23/2017  . Fracture of femoral neck, right (HCC) 08/23/2017  . Memory loss 10/01/2016  . 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 foot surgeries     right foot on achilles tendon  . HIP PINNING,CANNULATED Right 08/23/2017   Procedure: CANNULATED HIP PINNING RIGHT;  Surgeon: Jodi Geralds, MD;  Location: [facility] ORS;  Service: Orthopedics;  Laterality: Right;  . NO PAST SURGERIES       OB History   None      Home Medications    Prior to Admission medications   Medication Sig Start Date End Date Taking? Authorizing Provider  atorvastatin (LIPITOR) 10 MG tablet Take 10 mg by mouth daily.  05/20/11  Yes [provider]  Calcium Carb-Cholecalciferol (CALCIUM 500/D) 500-400 MG-UNIT CHEW Chew 1 tablet by mouth daily.   Yes [provider]  docusate sodium (COLACE) 100 MG capsule Take 1 capsule (100 mg total) by mouth 2 (two) times daily. 08/26/17  Yes Alwyn Ren, MD  ferrous sulfate 325 (65 FE) MG EC tablet Take 325 mg by mouth 2 (two) times daily.   Yes [provider]  furosemide (LASIX) 40 MG tablet Take 40 mg by mouth daily.   Yes [provider]  HYDROcodone-acetaminophen (NORCO/VICODIN) 5-325 MG tablet Take 1 tablet by mouth 2 (two) times daily.   Yes [provider]  levothyroxine (SYNTHROID, LEVOTHROID) 125 MCG tablet Take 125 mcg by mouth daily before breakfast.  05/16/11  Yes [provider]  Melatonin 3 MG TABS Take 1 tablet by mouth at bedtime.    Yes [provider]  Multiple Vitamins-Minerals (ICAPS AREDS 2 PO) Take 2 tablets by mouth daily.   Yes [provider]  potassium  chloride (K-DUR,KLOR-CON) 10 MEQ tablet Take 10 mEq by mouth daily.   Yes [provider]  tiZANidine (ZANAFLEX) 2 MG tablet Take 1 mg by mouth 3 (three) times daily.   Yes [provider]  acetaminophen (TYLENOL) 325 MG tablet Take 1-2 tablets (325-650 mg total) by mouth every 6 (six) hours as needed for mild pain (pain score 1-3 or temp > 100.5). 08/26/17   Alwyn RenMathews, Elizabeth G, MD  aspirin EC 325 MG EC tablet Take 1 tablet (325 mg total) by mouth 2 (two) times daily. Patient not taking: Reported on 11/20/2017 08/26/17   Alwyn RenMathews, Elizabeth G,  MD  methocarbamol (ROBAXIN) 500 MG tablet Take 1 tablet (500 mg total) by mouth every 6 (six) hours as needed for muscle spasms. Patient not taking: Reported on 11/20/2017 08/26/17   Alwyn RenMathews, Elizabeth G, MD  polyethylene glycol [facility](MIRALAX / Ethelene HalGLYCOLAX) packet Take 17 g by mouth daily as needed for mild constipation. 08/26/17   Alwyn RenMathews, Elizabeth G, MD    Family History Family History  Problem Relation Age of Onset  . Cancer Mother   . Heart disease Mother   . Hyperlipidemia Mother   . Hypertension Mother     Social History Social History   Tobacco Use  . Smoking status: Former Smoker    Types: Cigarettes    Last attempt to quit: 07/14/1996    Years since quitting: 21.3  . Smokeless tobacco: Never Used  Substance Use Topics  . Alcohol use: No  . Drug use: No     Allergies   Patient has no known allergies.   Review of Systems Review of Systems  All other systems reviewed and are negative.    Physical Exam Updated Vital Signs BP 113/82 (BP Location: Right Arm)   Pulse 72   Temp 98.7 F (37.1 C) (Oral)   Resp 14   Ht 5\' 8"  (1.727 m)   Wt 81.6 kg   SpO2 100%   BMI 27.37 kg/m   Physical Exam  Constitutional: She is oriented to person, place, and time. She appears well-developed and well-nourished. No distress.  HENT:  Head: Normocephalic and atraumatic.  Neck: Normal range of motion. Neck supple.  Pulmonary/Chest: Effort normal.  Musculoskeletal:  The right hip appears grossly normal.  There is no obvious shortening or external rotation.  She has range of motion, however with pain.  DP pulses are easily palpable and motor and sensation are intact throughout the entire foot.  Neurological: She is alert and oriented to person, place, and time.  Skin: Skin is warm and dry. She is not diaphoretic.  Nursing note and vitals reviewed.    ED Treatments / Results  Labs (all labs ordered are listed, but only abnormal results are displayed) Labs Reviewed - No data to  display  EKG None  Radiology Dg Hips Bilat With Pelvis 3-4 Views  Result Date: 11/20/2017 CLINICAL DATA:  Unwitnessed fall EXAM: DG HIP (WITH OR WITHOUT PELVIS) 3-4V BILAT COMPARISON:  08/26/2017, 08/23/2017, head CT 10/24/2017 FINDINGS: SI joints are non widened.  Pubic symphysis and rami appear intact. Left hip: No fracture or malalignment. Joint space is maintained. Shortened appearance of the right femoral neck with protrusion of proximal aspects of the screws beyond femoral cortex, grossly similar as compared with CT from 10/24/2017, changed in appearance since 08/23/2017 fluoroscopic images. Superior most fixating screw approaches the femoral head cortex. IMPRESSION: 1. No acute osseous abnormality of the left hip 2. Prior screw fixation of right femoral neck fracture.  Alignment grossly similar as compared with CT 10/24/2017 at which time it was noted that the screws or protruding from the lateral femoral cortex and there was interval collapse of right femoral head on the femoral neck. Tip of the most cephalad screw approaches or possibly breaches the right femoral head cortex. Electronically Signed   By: Jasmine Pang M.D.   On: 11/20/2017 23:54    Procedures Procedures (including critical care time)  Medications Ordered in ED Medications  fentaNYL (SUBLIMAZE) injection 50 mcg (50 mcg Intravenous Given 11/20/17 2250)     Initial Impression / Assessment and Plan / ED Course  I have reviewed the triage vital signs and the nursing notes.  Pertinent labs & imaging results that were available during my care of the patient were reviewed by me and considered in my medical decision making (see chart for details).  X-rays today show compression of the femoral neck consistent with prior CT scan from July, but no acute fracture.  She was able to ambulate in the ED, with some difficulty, however the son states that this is about how she normally does.  She is somewhat confused and agitated as  sometimes happens per the son when she is out of her usual environment.  We have discussed the disposition and the son is comfortable with her returning to her extended care facility.  She will be discharged, to follow-up with her orthopedist.  Final Clinical Impressions(s) / ED Diagnoses   Final diagnoses:  Fall    ED Discharge Orders    None       Geoffery Lyons, MD 11/21/17 [phone]

## 2017-11-28 ENCOUNTER — Non-Acute Institutional Stay: Payer: Medicare Other | Admitting: Nurse Practitioner

## 2017-11-28 ENCOUNTER — Encounter: Payer: Self-pay | Admitting: Nurse Practitioner

## 2017-11-28 DIAGNOSIS — R269 Unspecified abnormalities of gait and mobility: Secondary | ICD-10-CM

## 2017-11-28 DIAGNOSIS — Z515 Encounter for palliative care: Secondary | ICD-10-CM

## 2017-11-28 DIAGNOSIS — F0390 Unspecified dementia without behavioral disturbance: Secondary | ICD-10-CM

## 2017-11-28 NOTE — Consult Note (Signed)
PALLIATIVE CARE CONSULT VISIT   PATIENT NAME: Terri Vasquez DOB: 1940/07/08 MRN: [id number]  PRIMARY CARE PROVIDER:   Renford Dills, MD  REFERRING PROVIDER:  Renford Dills, MD [address] [place], [place] [address]  RESPONSIBLE PARTY:   Jackelyne Sayer (son) [phone]  Interim history: patient now using walker most of the time due to non-healing of right hip pinning. Patient scheduled for THR by Dr. Luiz Blare on 12/03/17. Patient has no acute complaints; denies fever, chills, weight change, no change in appetite,   Impression and recommendations:  Dementia without behavioral disturbance -thought process is disorganized and circumstantial -oral intake is good; weight is stable -continue supportive Care  Right Hip fracture s/p pinning -Followed by Dr. Luiz Blare (ortho); scheduled for [facility] 12/09/17   -Chronic medical problems -HTN -Hypothyroidism -Managed by primary team; continue regimen as prescribed    ACP -DNR on chart   I spent 15 minutes providing this consultation,  from 14:30 to 14:45. More than 50% of the time in this consultation was spent coordinating communication.   HISTORY OF PRESENT ILLNESS:  Terri Vasquez is a 77 y.o. year old female with multiple medical problems including recent right hip fracture s/p pinning not healing well,dementia, HTN, hypothyroidism, . Palliative Care was asked to assist with coordination of care, symptom management and ongoing support .   CODE STATUS: DNR  PPS: 60%  FAST 6B HOSPICE ELIGIBILITY/DIAGNOSIS: TBD  PAST MEDICAL HISTORY:  Past Medical History:  Diagnosis Date  . AKI (acute kidney injury) (HCC) 08/24/2017  . Essential hypertension 08/24/2017  . Family history of adverse reaction to anesthesia    sons have difficulty waking up  . Hypertension   . Hypothyroid 08/24/2017  . Hypothyroidism   . Thyroid disease   . Varicose veins     SOCIAL HX:  Social History   Tobacco Use  . Smoking status: Former Smoker    Types:  Cigarettes    Last attempt to quit: 07/14/1996    Years since quitting: 21.3  . Smokeless tobacco: Never Used  Substance Use Topics  . Alcohol use: No    ALLERGIES: No Known Allergies   PERTINENT MEDICATIONS:  Outpatient Encounter Medications as of 11/28/2017  Medication Sig  . acetaminophen (TYLENOL) 325 MG tablet Take 1-2 tablets (325-650 mg total) by mouth every 6 (six) hours as needed for mild pain (pain score 1-3 or temp > 100.5).  Marland Kitchen aspirin EC 325 MG EC tablet Take 1 tablet (325 mg total) by mouth 2 (two) times daily. (Patient not taking: Reported on 11/20/2017)  . atorvastatin (LIPITOR) 10 MG tablet Take 10 mg by mouth daily.   . Calcium Carb-Cholecalciferol (CALCIUM 500/D) 500-400 MG-UNIT CHEW Chew 1 tablet by mouth daily.  Marland Kitchen docusate sodium (COLACE) 100 MG capsule Take 1 capsule (100 mg total) by mouth 2 (two) times daily.  . ferrous sulfate 325 (65 FE) MG EC tablet Take 325 mg by mouth 2 (two) times daily.  . furosemide (LASIX) 40 MG tablet Take 40 mg by mouth daily.  Marland Kitchen HYDROcodone-acetaminophen (NORCO/VICODIN) 5-325 MG tablet Take 1 tablet by mouth 2 (two) times daily.  Marland Kitchen levothyroxine (SYNTHROID, LEVOTHROID) 125 MCG tablet Take 125 mcg by mouth daily before breakfast.   . Melatonin 3 MG TABS Take 1 tablet by mouth at bedtime.   . methocarbamol (ROBAXIN) 500 MG tablet Take 1 tablet (500 mg total) by mouth every 6 (six) hours as needed for muscle spasms. (Patient not taking: Reported on 11/20/2017)  .  Multiple Vitamins-Minerals (ICAPS AREDS 2 PO) Take 2 tablets by mouth daily.  . polyethylene glycol (MIRALAX / GLYCOLAX) packet Take 17 g by mouth daily as needed for mild constipation.  . potassium chloride (K-DUR,KLOR-CON) 10 MEQ tablet Take 10 mEq by mouth daily.  Marland Kitchen tiZANidine (ZANAFLEX) 2 MG tablet Take 1 mg by mouth 3 (three) times daily.   No facility-administered encounter medications on file as of 11/28/2017.     PHYSICAL EXAM:   General: NAD, Well developed, well nourished   Cardiovascular: regular rate and rhythm Pulmonary: clear ant fields Abdomen: soft, nontender, + bowel sounds GU: no suprapubic tenderness Extremities: 1+ edema, no joint deformities Skin: no rashes Neurological: non-focal; thought process disorganized and circumstantial  Stephanie G [place], NP

## 2017-11-28 NOTE — Progress Notes (Signed)
This is a blank note please discard 

## 2017-12-02 NOTE — Pre-Procedure Instructions (Signed)
Terri Vasquez  12/02/2017      [facility] [address] [place], [place] - [address] [place] [address] Phone: [phone] Fax: [phone]    Your procedure is scheduled on Monday October 7th.  Report to [facility] Admitting at 1000 A.M.  Call this number if you have problems the morning of surgery:  [phone]   Remember:  Do not eat or drink after midnight.    Take these medicines the morning of surgery with A SIP OF WATER   Tylenol (if needed)  Atorvastatin  Colace  Hydrocodone (if needed)  Synthroid  Robaxin (if needed)  Zanaflex   7 days prior to surgery STOP taking any Aspirin(unless otherwise instructed by your surgeon), Aleve, Naproxen, Ibuprofen, Motrin, Advil, Goody's, BC's, all herbal medications, fish oil, and all vitamins   Do not wear jewelry, make-up or nail polish.  Do not wear lotions, powders, or perfumes, or deodorant.  Do not shave 48 hours prior to surgery.  Men may shave face and neck.  Do not bring valuables to the hospital.  [facility] is not responsible for any belongings or valuables.  Contacts, dentures or bridgework may not be worn into surgery.  Leave your suitcase in the car.  After surgery it may be brought to your room.  For patients admitted to the hospital, discharge time will be determined by your treatment team.  Patients discharged the day of surgery will not be allowed to drive home.    [facility]- Preparing For Surgery  Before surgery, you can play an important role. Because skin is not sterile, your skin needs to be as free of germs as possible. You can reduce the number of germs on your skin by washing with CHG (chlorahexidine gluconate) Soap before surgery.  CHG is an antiseptic cleaner which kills germs and bonds with the skin to continue killing germs even after washing.    Oral Hygiene is also important to reduce your risk of infection.  Remember - BRUSH YOUR TEETH THE MORNING OF  SURGERY WITH YOUR REGULAR TOOTHPASTE  Please do not use if you have an allergy to CHG or antibacterial soaps. If your skin becomes reddened/irritated stop using the CHG.  Do not shave (including legs and underarms) for at least 48 hours prior to first CHG shower. It is OK to shave your face.  Please follow these instructions carefully.   1. Shower the NIGHT BEFORE SURGERY and the MORNING OF SURGERY with CHG.   2. If you chose to wash your hair, wash your hair first as usual with your normal shampoo.  3. After you shampoo, rinse your hair and body thoroughly to remove the shampoo.  4. Use CHG as you would any other liquid soap. You can apply CHG directly to the skin and wash gently with a scrungie or a clean washcloth.   5. Apply the CHG Soap to your body ONLY FROM THE NECK DOWN.  Do not use on open wounds or open sores. Avoid contact with your eyes, ears, mouth and genitals (private parts). Wash Face and genitals (private parts)  with your normal soap.  6. Wash thoroughly, paying special attention to the area where your surgery will be performed.  7. Thoroughly rinse your body with warm water from the neck down.  8. DO NOT shower/wash with your normal soap after using and rinsing off the CHG Soap.  9. Pat yourself dry with a CLEAN TOWEL.  10.  Wear CLEAN PAJAMAS to bed the night before surgery, wear comfortable clothes the morning of surgery  11. Place CLEAN SHEETS on your bed the night of your first shower and DO NOT SLEEP WITH PETS.    Day of Surgery:  Do not apply any deodorants/lotions.  Please wear clean clothes to the hospital/surgery center.   Remember to brush your teeth WITH YOUR REGULAR TOOTHPASTE.    Please read over the following fact sheets that you were given.

## 2017-12-03 ENCOUNTER — Other Ambulatory Visit: Payer: Self-pay

## 2017-12-03 ENCOUNTER — Encounter (HOSPITAL_COMMUNITY)
Admission: RE | Admit: 2017-12-03 | Discharge: 2017-12-03 | Disposition: A | Discharge status: Home / Self Care | Payer: Medicare Other | Source: Ambulatory Visit | Attending: Orthopedic Surgery | Admitting: Orthopedic Surgery

## 2017-12-03 ENCOUNTER — Ambulatory Visit (HOSPITAL_COMMUNITY)
Admission: RE | Admit: 2017-12-03 | Discharge: 2017-12-03 | Disposition: A | Discharge status: Home / Self Care | Payer: Medicare Other | Source: Ambulatory Visit | Attending: Orthopedic Surgery | Admitting: Orthopedic Surgery

## 2017-12-03 ENCOUNTER — Encounter (HOSPITAL_COMMUNITY): Payer: Self-pay

## 2017-12-03 DIAGNOSIS — Z01810 Encounter for preprocedural cardiovascular examination: Secondary | ICD-10-CM | POA: Insufficient documentation

## 2017-12-03 DIAGNOSIS — J984 Other disorders of lung: Secondary | ICD-10-CM | POA: Insufficient documentation

## 2017-12-03 DIAGNOSIS — Z01812 Encounter for preprocedural laboratory examination: Secondary | ICD-10-CM | POA: Diagnosis not present

## 2017-12-03 DIAGNOSIS — I700 Atherosclerosis of aorta: Secondary | ICD-10-CM | POA: Insufficient documentation

## 2017-12-03 DIAGNOSIS — R9431 Abnormal electrocardiogram [ECG] [EKG]: Secondary | ICD-10-CM | POA: Insufficient documentation

## 2017-12-03 DIAGNOSIS — Z01818 Encounter for other preprocedural examination: Secondary | ICD-10-CM | POA: Diagnosis present

## 2017-12-03 HISTORY — DX: Anxiety disorder, unspecified: F41.9

## 2017-12-03 LAB — TYPE AND SCREEN
ABO/RH(D): A POS
ANTIBODY SCREEN: NEGATIVE

## 2017-12-03 LAB — URINALYSIS, ROUTINE W REFLEX MICROSCOPIC
Bilirubin Urine: NEGATIVE
GLUCOSE, UA: NEGATIVE mg/dL
Hgb urine dipstick: NEGATIVE
Ketones, ur: NEGATIVE mg/dL
Nitrite: NEGATIVE
PH: 6 (ref 5.0–8.0)
PROTEIN: NEGATIVE mg/dL
SPECIFIC GRAVITY, URINE: 1.009 (ref 1.005–1.030)

## 2017-12-03 LAB — COMPREHENSIVE METABOLIC PANEL
ALBUMIN: 3.6 g/dL (ref 3.5–5.0)
ALT: 15 U/L (ref 0–44)
ANION GAP: 11 (ref 5–15)
AST: 18 U/L (ref 15–41)
Alkaline Phosphatase: 67 U/L (ref 38–126)
BILIRUBIN TOTAL: 0.5 mg/dL (ref 0.3–1.2)
BUN: 17 mg/dL (ref 8–23)
CO2: 27 mmol/L (ref 22–32)
Calcium: 9.2 mg/dL (ref 8.9–10.3)
Chloride: 103 mmol/L (ref 98–111)
Creatinine, Ser: 0.99 mg/dL (ref 0.44–1.00)
GFR calc Af Amer: >60 mL/min (ref >60)
GFR, EST NON AFRICAN AMERICAN: 54 mL/min — AB (ref >60)
Glucose, Bld: 95 mg/dL (ref 70–99)
POTASSIUM: 3.3 mmol/L — AB (ref 3.5–5.1)
Sodium: 141 mmol/L (ref 135–145)
TOTAL PROTEIN: 7.3 g/dL (ref 6.5–8.1)

## 2017-12-03 LAB — SURGICAL PCR SCREEN
MRSA, PCR: NEGATIVE
Staphylococcus aureus: NEGATIVE

## 2017-12-03 LAB — CBC WITH DIFFERENTIAL/PLATELET
Abs Immature Granulocytes: 0 10*3/uL (ref 0.0–0.1)
BASOS PCT: 1 %
Basophils Absolute: 0.1 10*3/uL (ref 0.0–0.1)
EOS ABS: 0.1 10*3/uL (ref 0.0–0.7)
EOS PCT: 2 %
HEMATOCRIT: 37.9 % (ref 36.0–46.0)
Hemoglobin: 11.9 g/dL — ABNORMAL LOW (ref 12.0–15.0)
Immature Granulocytes: 0 %
LYMPHS ABS: 1 10*3/uL (ref 0.7–4.0)
Lymphocytes Relative: 15 %
MCH: 28.1 pg (ref 26.0–34.0)
MCHC: 31.4 g/dL (ref 30.0–36.0)
MCV: 89.4 fL (ref 78.0–100.0)
Monocytes Absolute: 0.6 10*3/uL (ref 0.1–1.0)
Monocytes Relative: 8 %
Neutro Abs: 5.2 10*3/uL (ref 1.7–7.7)
Neutrophils Relative %: 74 %
Platelets: 315 10*3/uL (ref 150–400)
RBC: 4.24 MIL/uL (ref 3.87–5.11)
RDW: 13.8 % (ref 11.5–15.5)
WBC: 7 10*3/uL (ref 4.0–10.5)

## 2017-12-03 LAB — PROTIME-INR
INR: 0.99
Prothrombin Time: 13 seconds (ref 11.4–15.2)

## 2017-12-03 LAB — ABO/RH: ABO/RH(D): A POS

## 2017-12-03 LAB — APTT: aPTT: 31 seconds (ref 24–36)

## 2017-12-03 NOTE — Progress Notes (Signed)
PCP -  [facility] MD   Chest x-ray - 12/03/17 EKG - 12/03/17  Blood Thinner Instructions: N/A Aspirin Instructions: N/A  Anesthesia review: none  Patient denies shortness of breath, fever, cough and chest pain at PAT appointment   Patient verbalized understanding of instructions that were given to them at the PAT appointment. Patient was also instructed that they will need to review over the PAT instructions again at home before surgery.

## 2017-12-09 ENCOUNTER — Inpatient Hospital Stay (HOSPITAL_COMMUNITY)
Admission: RE | Admit: 2017-12-09 | Discharge: 2017-12-12 | DRG: 470 | Disposition: A | Discharge status: Skilled Nursing Facility | Payer: Medicare Other | Attending: Orthopedic Surgery | Admitting: Orthopedic Surgery

## 2017-12-09 ENCOUNTER — Inpatient Hospital Stay (HOSPITAL_COMMUNITY): Payer: Medicare Other | Admitting: Anesthesiology

## 2017-12-09 ENCOUNTER — Inpatient Hospital Stay (HOSPITAL_COMMUNITY): Payer: Medicare Other | Admitting: Physician Assistant

## 2017-12-09 ENCOUNTER — Encounter (HOSPITAL_COMMUNITY)
Admission: RE | Disposition: A | Discharge status: Skilled Nursing Facility | Payer: Self-pay | Source: Home / Self Care | Attending: Orthopedic Surgery

## 2017-12-09 ENCOUNTER — Inpatient Hospital Stay (HOSPITAL_COMMUNITY): Payer: Medicare Other

## 2017-12-09 ENCOUNTER — Encounter (HOSPITAL_COMMUNITY): Payer: Self-pay

## 2017-12-09 DIAGNOSIS — F03918 Unspecified dementia, unspecified severity, with other behavioral disturbance: Secondary | ICD-10-CM | POA: Diagnosis present

## 2017-12-09 DIAGNOSIS — Z8349 Family history of other endocrine, nutritional and metabolic diseases: Secondary | ICD-10-CM

## 2017-12-09 DIAGNOSIS — W19XXXD Unspecified fall, subsequent encounter: Secondary | ICD-10-CM | POA: Diagnosis present

## 2017-12-09 DIAGNOSIS — D72829 Elevated white blood cell count, unspecified: Secondary | ICD-10-CM | POA: Diagnosis not present

## 2017-12-09 DIAGNOSIS — E785 Hyperlipidemia, unspecified: Secondary | ICD-10-CM | POA: Diagnosis present

## 2017-12-09 DIAGNOSIS — Z87891 Personal history of nicotine dependence: Secondary | ICD-10-CM | POA: Diagnosis not present

## 2017-12-09 DIAGNOSIS — Z969 Presence of functional implant, unspecified: Secondary | ICD-10-CM

## 2017-12-09 DIAGNOSIS — D62 Acute posthemorrhagic anemia: Secondary | ICD-10-CM | POA: Diagnosis not present

## 2017-12-09 DIAGNOSIS — S72001K Fracture of unspecified part of neck of right femur, subsequent encounter for closed fracture with nonunion: Secondary | ICD-10-CM

## 2017-12-09 DIAGNOSIS — Z8249 Family history of ischemic heart disease and other diseases of the circulatory system: Secondary | ICD-10-CM | POA: Diagnosis not present

## 2017-12-09 DIAGNOSIS — Z96649 Presence of unspecified artificial hip joint: Secondary | ICD-10-CM

## 2017-12-09 DIAGNOSIS — M25551 Pain in right hip: Secondary | ICD-10-CM | POA: Diagnosis present

## 2017-12-09 DIAGNOSIS — Z66 Do not resuscitate: Secondary | ICD-10-CM | POA: Diagnosis present

## 2017-12-09 DIAGNOSIS — Z79890 Hormone replacement therapy (postmenopausal): Secondary | ICD-10-CM | POA: Diagnosis not present

## 2017-12-09 DIAGNOSIS — T501X5A Adverse effect of loop [high-ceiling] diuretics, initial encounter: Secondary | ICD-10-CM | POA: Diagnosis present

## 2017-12-09 DIAGNOSIS — E876 Hypokalemia: Secondary | ICD-10-CM | POA: Diagnosis present

## 2017-12-09 DIAGNOSIS — E039 Hypothyroidism, unspecified: Secondary | ICD-10-CM | POA: Diagnosis present

## 2017-12-09 DIAGNOSIS — Z23 Encounter for immunization: Secondary | ICD-10-CM

## 2017-12-09 DIAGNOSIS — M1611 Unilateral primary osteoarthritis, right hip: Secondary | ICD-10-CM | POA: Diagnosis present

## 2017-12-09 DIAGNOSIS — Z419 Encounter for procedure for purposes other than remedying health state, unspecified: Secondary | ICD-10-CM

## 2017-12-09 DIAGNOSIS — Z79899 Other long term (current) drug therapy: Secondary | ICD-10-CM | POA: Diagnosis not present

## 2017-12-09 DIAGNOSIS — I10 Essential (primary) hypertension: Secondary | ICD-10-CM | POA: Diagnosis not present

## 2017-12-09 DIAGNOSIS — T84114A Breakdown (mechanical) of internal fixation device of right femur, initial encounter: Principal | ICD-10-CM | POA: Diagnosis present

## 2017-12-09 DIAGNOSIS — Y838 Other surgical procedures as the cause of abnormal reaction of the patient, or of later complication, without mention of misadventure at the time of the procedure: Secondary | ICD-10-CM | POA: Diagnosis present

## 2017-12-09 DIAGNOSIS — R600 Localized edema: Secondary | ICD-10-CM | POA: Diagnosis not present

## 2017-12-09 DIAGNOSIS — F0391 Unspecified dementia with behavioral disturbance: Secondary | ICD-10-CM | POA: Diagnosis present

## 2017-12-09 HISTORY — PX: HARDWARE REMOVAL: SHX979

## 2017-12-09 HISTORY — PX: TOTAL HIP ARTHROPLASTY: SHX124

## 2017-12-09 SURGERY — ARTHROPLASTY, HIP, TOTAL, ANTERIOR APPROACH
Anesthesia: Spinal | Site: Hip | Laterality: Right

## 2017-12-09 MED ORDER — TRANEXAMIC ACID 1000 MG/10ML IV SOLN
1000.0000 mg | INTRAVENOUS | Status: AC
  Administered 2017-12-09: 1000 mg via INTRAVENOUS
  Filled 2017-12-09: qty 1000

## 2017-12-09 MED ORDER — FENTANYL CITRATE (PF) 100 MCG/2ML IJ SOLN
INTRAMUSCULAR | Status: AC
  Administered 2017-12-09: 50 ug via INTRAVENOUS
  Filled 2017-12-09: qty 2

## 2017-12-09 MED ORDER — MEPERIDINE HCL 50 MG/ML IJ SOLN
INTRAMUSCULAR | Status: AC
  Filled 2017-12-09: qty 1

## 2017-12-09 MED ORDER — DOCUSATE SODIUM 100 MG PO CAPS
100.0000 mg | ORAL_CAPSULE | Freq: Two times a day (BID) | ORAL | Status: DC
  Administered 2017-12-10 – 2017-12-12 (×6): 100 mg via ORAL
  Filled 2017-12-09 (×6): qty 1

## 2017-12-09 MED ORDER — POLYETHYLENE GLYCOL 3350 17 G PO PACK: 17.0000 g | PACK | Freq: Every day | ORAL | Status: DC | PRN

## 2017-12-09 MED ORDER — HYDROCODONE-ACETAMINOPHEN 5-325 MG PO TABS
1.0000 | ORAL_TABLET | Freq: Once | ORAL | Status: AC
  Administered 2017-12-09: 1 via ORAL

## 2017-12-09 MED ORDER — CELECOXIB 200 MG PO CAPS
200.0000 mg | ORAL_CAPSULE | Freq: Every day | ORAL | Status: DC
  Administered 2017-12-10 – 2017-12-12 (×3): 200 mg via ORAL
  Filled 2017-12-09 (×4): qty 1

## 2017-12-09 MED ORDER — FENTANYL CITRATE (PF) 100 MCG/2ML IJ SOLN
INTRAMUSCULAR | Status: AC
  Filled 2017-12-09: qty 2

## 2017-12-09 MED ORDER — BUPIVACAINE HCL (PF) 0.5 % IJ SOLN
INTRAMUSCULAR | Status: AC
  Filled 2017-12-09: qty 30

## 2017-12-09 MED ORDER — PHENYLEPHRINE HCL 10 MG/ML IJ SOLN
INTRAMUSCULAR | Status: DC | PRN
  Administered 2017-12-09 (×5): 80 ug via INTRAVENOUS

## 2017-12-09 MED ORDER — SODIUM CHLORIDE 0.9 % IV SOLN
INTRAVENOUS | Status: DC
  Administered 2017-12-09: 22:00:00 via INTRAVENOUS

## 2017-12-09 MED ORDER — MEPERIDINE HCL 50 MG/ML IJ SOLN
12.5000 mg | Freq: Once | INTRAMUSCULAR | Status: AC
  Administered 2017-12-09: 12.5 mg via INTRAVENOUS

## 2017-12-09 MED ORDER — CEFAZOLIN SODIUM-DEXTROSE 2-4 GM/100ML-% IV SOLN
2.0000 g | INTRAVENOUS | Status: AC
  Administered 2017-12-09: 2 g via INTRAVENOUS
  Filled 2017-12-09: qty 100

## 2017-12-09 MED ORDER — ACETAMINOPHEN 325 MG PO TABS
325.0000 mg | ORAL_TABLET | Freq: Four times a day (QID) | ORAL | Status: DC | PRN
  Administered 2017-12-11: 650 mg via ORAL
  Filled 2017-12-09: qty 2

## 2017-12-09 MED ORDER — BUPIVACAINE IN DEXTROSE 0.75-8.25 % IT SOLN
INTRATHECAL | Status: DC | PRN
  Administered 2017-12-09: 1.8 mL via INTRATHECAL

## 2017-12-09 MED ORDER — FUROSEMIDE 40 MG PO TABS
40.0000 mg | ORAL_TABLET | Freq: Every day | ORAL | Status: DC
  Administered 2017-12-10 – 2017-12-12 (×3): 40 mg via ORAL
  Filled 2017-12-09 (×3): qty 1

## 2017-12-09 MED ORDER — DEXAMETHASONE SODIUM PHOSPHATE 10 MG/ML IJ SOLN
10.0000 mg | Freq: Two times a day (BID) | INTRAMUSCULAR | Status: AC
  Administered 2017-12-10 (×3): 10 mg via INTRAVENOUS
  Filled 2017-12-09 (×3): qty 1

## 2017-12-09 MED ORDER — PROPOFOL 10 MG/ML IV BOLUS
INTRAVENOUS | Status: AC
  Filled 2017-12-09: qty 20

## 2017-12-09 MED ORDER — 0.9 % SODIUM CHLORIDE (POUR BTL) OPTIME
TOPICAL | Status: DC | PRN
  Administered 2017-12-09: 1000 mL

## 2017-12-09 MED ORDER — KETOROLAC TROMETHAMINE 30 MG/ML IJ SOLN
INTRAMUSCULAR | Status: AC
  Filled 2017-12-09: qty 1

## 2017-12-09 MED ORDER — SODIUM CHLORIDE 0.9 % IV SOLN
INTRAVENOUS | Status: DC | PRN
  Administered 2017-12-09: 50 ug/min via INTRAVENOUS

## 2017-12-09 MED ORDER — METHOCARBAMOL 500 MG PO TABS
500.0000 mg | ORAL_TABLET | Freq: Four times a day (QID) | ORAL | Status: DC | PRN
  Administered 2017-12-09: 500 mg via ORAL
  Filled 2017-12-09: qty 1

## 2017-12-09 MED ORDER — DIPHENHYDRAMINE HCL 12.5 MG/5ML PO ELIX: 12.5000 mg | ORAL_SOLUTION | ORAL | Status: DC | PRN

## 2017-12-09 MED ORDER — PROPOFOL 500 MG/50ML IV EMUL
INTRAVENOUS | Status: DC | PRN
  Administered 2017-12-09: 75 ug/kg/min via INTRAVENOUS

## 2017-12-09 MED ORDER — MIDAZOLAM HCL 2 MG/2ML IJ SOLN
INTRAMUSCULAR | Status: AC
  Administered 2017-12-09: 1 mg via INTRAVENOUS
  Filled 2017-12-09: qty 2

## 2017-12-09 MED ORDER — ALUM & MAG HYDROXIDE-SIMETH 200-200-20 MG/5ML PO SUSP: 30.0000 mL | ORAL | Status: DC | PRN

## 2017-12-09 MED ORDER — FERROUS SULFATE 325 (65 FE) MG PO TABS
325.0000 mg | ORAL_TABLET | Freq: Every day | ORAL | Status: DC
  Administered 2017-12-10 – 2017-12-12 (×3): 325 mg via ORAL
  Filled 2017-12-09 (×3): qty 1

## 2017-12-09 MED ORDER — ONDANSETRON HCL 4 MG/2ML IJ SOLN: 4.0000 mg | Freq: Four times a day (QID) | INTRAMUSCULAR | Status: DC | PRN

## 2017-12-09 MED ORDER — LIDOCAINE HCL (CARDIAC) PF 100 MG/5ML IV SOSY
PREFILLED_SYRINGE | INTRAVENOUS | Status: DC | PRN
  Administered 2017-12-09: 40 mg via INTRAVENOUS

## 2017-12-09 MED ORDER — OCUVITE-LUTEIN PO CAPS
ORAL_CAPSULE | Freq: Every day | ORAL | Status: DC
  Filled 2017-12-09: qty 1

## 2017-12-09 MED ORDER — LIDOCAINE 2% (20 MG/ML) 5 ML SYRINGE
INTRAMUSCULAR | Status: AC
  Filled 2017-12-09: qty 5

## 2017-12-09 MED ORDER — MELATONIN 3 MG PO TABS
1.0000 | ORAL_TABLET | Freq: Every day | ORAL | Status: DC
  Administered 2017-12-10 – 2017-12-11 (×3): 3 mg via ORAL
  Filled 2017-12-09 (×4): qty 1

## 2017-12-09 MED ORDER — CHLORHEXIDINE GLUCONATE 4 % EX LIQD: 60.0000 mL | Freq: Once | CUTANEOUS | Status: DC

## 2017-12-09 MED ORDER — FENTANYL CITRATE (PF) 100 MCG/2ML IJ SOLN
25.0000 ug | INTRAMUSCULAR | Status: DC | PRN
  Administered 2017-12-09 (×2): 25 ug via INTRAVENOUS

## 2017-12-09 MED ORDER — METHOCARBAMOL 1000 MG/10ML IJ SOLN
500.0000 mg | Freq: Four times a day (QID) | INTRAVENOUS | Status: DC | PRN
  Filled 2017-12-09: qty 5

## 2017-12-09 MED ORDER — ASPIRIN EC 325 MG PO TBEC
325.0000 mg | DELAYED_RELEASE_TABLET | Freq: Two times a day (BID) | ORAL | Status: DC
  Administered 2017-12-10 – 2017-12-12 (×5): 325 mg via ORAL
  Filled 2017-12-09 (×5): qty 1

## 2017-12-09 MED ORDER — FENTANYL CITRATE (PF) 100 MCG/2ML IJ SOLN
50.0000 ug | Freq: Once | INTRAMUSCULAR | Status: AC
  Administered 2017-12-09: 50 ug via INTRAVENOUS

## 2017-12-09 MED ORDER — BISACODYL 5 MG PO TBEC: 5.0000 mg | DELAYED_RELEASE_TABLET | Freq: Every day | ORAL | Status: DC | PRN

## 2017-12-09 MED ORDER — ATORVASTATIN CALCIUM 10 MG PO TABS
10.0000 mg | ORAL_TABLET | Freq: Every day | ORAL | Status: DC
  Administered 2017-12-10 – 2017-12-12 (×3): 10 mg via ORAL
  Filled 2017-12-09 (×3): qty 1

## 2017-12-09 MED ORDER — PROPOFOL 10 MG/ML IV BOLUS
INTRAVENOUS | Status: DC | PRN
  Administered 2017-12-09: 30 mg via INTRAVENOUS

## 2017-12-09 MED ORDER — POTASSIUM CHLORIDE CRYS ER 10 MEQ PO TBCR: 10.0000 meq | EXTENDED_RELEASE_TABLET | Freq: Every day | ORAL | Status: DC

## 2017-12-09 MED ORDER — BUPIVACAINE LIPOSOME 1.3 % IJ SUSP
10.0000 mL | Freq: Once | INTRAMUSCULAR | Status: AC
  Administered 2017-12-09: 20 mL
  Filled 2017-12-09: qty 10

## 2017-12-09 MED ORDER — TRANEXAMIC ACID 1000 MG/10ML IV SOLN
1000.0000 mg | Freq: Once | INTRAVENOUS | Status: AC
  Administered 2017-12-10: 1000 mg via INTRAVENOUS
  Filled 2017-12-09: qty 10

## 2017-12-09 MED ORDER — HYDROCODONE-ACETAMINOPHEN 5-325 MG PO TABS
ORAL_TABLET | ORAL | Status: AC
  Filled 2017-12-09: qty 1

## 2017-12-09 MED ORDER — MIDAZOLAM HCL 2 MG/2ML IJ SOLN
1.0000 mg | Freq: Once | INTRAMUSCULAR | Status: AC
  Administered 2017-12-09: 1 mg via INTRAVENOUS

## 2017-12-09 MED ORDER — CEFAZOLIN SODIUM-DEXTROSE 2-4 GM/100ML-% IV SOLN
2.0000 g | Freq: Four times a day (QID) | INTRAVENOUS | Status: AC
  Administered 2017-12-10 (×2): 2 g via INTRAVENOUS
  Filled 2017-12-09 (×2): qty 100

## 2017-12-09 MED ORDER — LACTATED RINGERS IV SOLN
INTRAVENOUS | Status: DC | PRN
  Administered 2017-12-09 (×2): via INTRAVENOUS

## 2017-12-09 MED ORDER — LEVOTHYROXINE SODIUM 25 MCG PO TABS
125.0000 ug | ORAL_TABLET | Freq: Every day | ORAL | Status: DC
  Administered 2017-12-10 – 2017-12-12 (×3): 125 ug via ORAL
  Filled 2017-12-09 (×3): qty 1

## 2017-12-09 MED ORDER — BUPIVACAINE HCL 0.5 % IJ SOLN
INTRAMUSCULAR | Status: DC | PRN
  Administered 2017-12-09: 30 mL

## 2017-12-09 MED ORDER — MORPHINE SULFATE (PF) 2 MG/ML IV SOLN: 0.5000 mg | INTRAVENOUS | Status: DC | PRN

## 2017-12-09 MED ORDER — LACTATED RINGERS IV SOLN
INTRAVENOUS | Status: DC
  Administered 2017-12-09: 12:00:00 via INTRAVENOUS

## 2017-12-09 MED ORDER — ONDANSETRON HCL 4 MG PO TABS: 4.0000 mg | ORAL_TABLET | Freq: Four times a day (QID) | ORAL | Status: DC | PRN

## 2017-12-09 MED ORDER — MAGNESIUM CITRATE PO SOLN: 1.0000 | Freq: Once | ORAL | Status: DC | PRN

## 2017-12-09 MED ORDER — HYDROCODONE-ACETAMINOPHEN 5-325 MG PO TABS
1.0000 | ORAL_TABLET | Freq: Four times a day (QID) | ORAL | Status: DC | PRN
  Administered 2017-12-09: 1 via ORAL
  Filled 2017-12-09: qty 1

## 2017-12-09 SURGICAL SUPPLY — 65 items
APL SKNCLS STERI-STRIP NONHPOA (GAUZE/BANDAGES/DRESSINGS) ×1
BALL HIP ARTICU EZE 36 8.5 (Hips) IMPLANT
BENZOIN TINCTURE PRP APPL 2/3 (GAUZE/BANDAGES/DRESSINGS) ×3 IMPLANT
BLADE CLIPPER SURG (BLADE) IMPLANT
BNDG COHESIVE 6X5 TAN STRL LF (GAUZE/BANDAGES/DRESSINGS) IMPLANT
BNDG GAUZE ELAST 4 BULKY (GAUZE/BANDAGES/DRESSINGS) IMPLANT
CELLS DAT CNTRL 66122 CELL SVR (MISCELLANEOUS) IMPLANT
CLOSURE STERI-STRIP 1/2X4 (GAUZE/BANDAGES/DRESSINGS)
CLSR STERI-STRIP ANTIMIC 1/2X4 (GAUZE/BANDAGES/DRESSINGS) IMPLANT
COVER PERINEAL POST (MISCELLANEOUS) ×3 IMPLANT
COVER SURGICAL LIGHT HANDLE (MISCELLANEOUS) ×3 IMPLANT
COVER WAND RF STERILE (DRAPES) ×3 IMPLANT
CUP ACETBLR 52 OD 100 SERIES (Hips) ×2 IMPLANT
DRAPE C-ARM 42X72 X-RAY (DRAPES) ×3 IMPLANT
DRAPE STERI IOBAN 125X83 (DRAPES) ×3 IMPLANT
DRAPE U-SHAPE 47X51 STRL (DRAPES) ×6 IMPLANT
DRSG AQUACEL AG ADV 3.5X 4 (GAUZE/BANDAGES/DRESSINGS) ×2 IMPLANT
DRSG AQUACEL AG ADV 3.5X10 (GAUZE/BANDAGES/DRESSINGS) ×3 IMPLANT
DURAPREP 26ML APPLICATOR (WOUND CARE) ×3 IMPLANT
ELECT BLADE 4.0 EZ CLEAN MEGAD (MISCELLANEOUS)
ELECT CAUTERY BLADE 6.4 (BLADE) ×3 IMPLANT
ELECT REM PT RETURN 9FT ADLT (ELECTROSURGICAL) ×3
ELECTRODE BLDE 4.0 EZ CLN MEGD (MISCELLANEOUS) IMPLANT
ELECTRODE REM PT RTRN 9FT ADLT (ELECTROSURGICAL) ×1 IMPLANT
ELIMINATOR HOLE APEX DEPUY (Hips) ×2 IMPLANT
GLOVE BIOGEL PI IND STRL 8 (GLOVE) ×2 IMPLANT
GLOVE BIOGEL PI INDICATOR 8 (GLOVE) ×4
GLOVE ECLIPSE 7.5 STRL STRAW (GLOVE) ×6 IMPLANT
GOWN STRL REUS W/ TWL LRG LVL3 (GOWN DISPOSABLE) ×2 IMPLANT
GOWN STRL REUS W/ TWL XL LVL3 (GOWN DISPOSABLE) ×2 IMPLANT
GOWN STRL REUS W/TWL LRG LVL3 (GOWN DISPOSABLE) ×6
GOWN STRL REUS W/TWL XL LVL3 (GOWN DISPOSABLE) ×6
HIP BALL ARTICU EZE 36 8.5 (Hips) ×3 IMPLANT
HOOD PEEL AWAY FACE SHEILD DIS (HOOD) ×6 IMPLANT
KIT BASIN OR (CUSTOM PROCEDURE TRAY) ×3 IMPLANT
KIT TURNOVER KIT B (KITS) ×3 IMPLANT
LINER NEUTRAL 52X36MM PLUS 4 (Liner) ×2 IMPLANT
MANIFOLD NEPTUNE II (INSTRUMENTS) ×3 IMPLANT
NDL SPNL 22GX3.5 QUINCKE BK (NEEDLE) ×1 IMPLANT
NEEDLE SPNL 22GX3.5 QUINCKE BK (NEEDLE) ×3 IMPLANT
NS IRRIG 1000ML POUR BTL (IV SOLUTION) ×3 IMPLANT
PACK TOTAL JOINT (CUSTOM PROCEDURE TRAY) ×3 IMPLANT
PACK UNIVERSAL I (CUSTOM PROCEDURE TRAY) ×3 IMPLANT
PAD ARMBOARD 7.5X6 YLW CONV (MISCELLANEOUS) ×6 IMPLANT
RETRACTOR WND ALEXIS 18 MED (MISCELLANEOUS) IMPLANT
RTRCTR WOUND ALEXIS 18CM MED (MISCELLANEOUS)
RTRCTR WOUND ALEXIS 18CM SML (INSTRUMENTS) ×3
SAVER CELL AAL HAEMONETICS (INSTRUMENTS) ×1 IMPLANT
SAW OSC TIP CART 19.5X105X1.3 (SAW) ×3 IMPLANT
SPONGE LAP 18X18 X RAY DECT (DISPOSABLE) IMPLANT
STAPLER VISISTAT 35W (STAPLE) ×2 IMPLANT
STEM CORAIL KA12 (Stem) ×2 IMPLANT
SUT ETHIBOND NAB CT1 #1 30IN (SUTURE) ×6 IMPLANT
SUT MNCRL AB 3-0 PS2 18 (SUTURE) IMPLANT
SUT VIC AB 0 CT1 27 (SUTURE) ×6
SUT VIC AB 0 CT1 27XBRD ANBCTR (SUTURE) ×1 IMPLANT
SUT VIC AB 1 CT1 27 (SUTURE) ×6
SUT VIC AB 1 CT1 27XBRD ANBCTR (SUTURE) ×2 IMPLANT
SUT VIC AB 2-0 CT1 27 (SUTURE) ×3
SUT VIC AB 2-0 CT1 TAPERPNT 27 (SUTURE) ×1 IMPLANT
SYR 50ML LL SCALE MARK (SYRINGE) ×3 IMPLANT
TOWEL OR 17X24 6PK STRL BLUE (TOWEL DISPOSABLE) ×3 IMPLANT
TOWEL OR 17X26 10 PK STRL BLUE (TOWEL DISPOSABLE) ×3 IMPLANT
TRAY CATH 16FR W/PLASTIC CATH (SET/KITS/TRAYS/PACK) IMPLANT
TRAY FOLEY CATH SILVER 16FR (SET/KITS/TRAYS/PACK) IMPLANT

## 2017-12-09 NOTE — Brief Op Note (Signed)
12/09/2017  5:02 PM  PATIENT:  Terri Vasquez  77 y.o. female  PRE-OPERATIVE DIAGNOSIS:  NON-UNION RIGHT FEMORAL NECK FRACTURE. STATUS POST RIGHT HIP PINNING  POST-OPERATIVE DIAGNOSIS:  NON-UNION RIGHT FEMORAL NECK FRACTURE. STATUS POST RIGHT HIP PINNING  PROCEDURE:  Procedure(s) with comments: RIGHT TOTAL HIP ARTHROPLASTY ANTERIOR APPROACH (Right) - CASE LENGTH: 2.5 HRS HARDWARE REMOVAL (Right) - removal of three cannulated screws from right hip  SURGEON:  Surgeon(s) and Role:    Jodi Geralds, MD - Primary  PHYSICIAN ASSISTANT:   ASSISTANTS: jim bethune PACv   ANESTHESIA:   spinal  EBL:  350CC   BLOOD ADMINISTERED:none  DRAINS: none   LOCAL MEDICATIONS USED:  MARCAINE    and OTHER experel  SPECIMEN:  No Specimen  DISPOSITION OF SPECIMEN:  N/A  COUNTS:  YES  TOURNIQUET:  * No tourniquets in log *  DICTATION: .Other Dictation: Dictation Number [id number]  PLAN OF CARE: Admit to inpatient   PATIENT DISPOSITION:  PACU - hemodynamically stable.   Delay start of Pharmacological VTE agent (>24hrs) due to surgical blood loss or risk of bleeding: no

## 2017-12-09 NOTE — Transfer of Care (Signed)
Immediate Anesthesia Transfer of Care Note  Patient: Terri Vasquez  Procedure(s) Performed: RIGHT TOTAL HIP ARTHROPLASTY ANTERIOR APPROACH (Right Hip) HARDWARE REMOVAL (Right Hip)  Patient Location: PACU  Anesthesia Type:Spinal  Level of Consciousness: awake  Airway & Oxygen Therapy: Patient Spontanous Breathing and Patient connected to nasal cannula oxygen  Post-op Assessment: Report given to RN and Post -op Vital signs reviewed and stable  Post vital signs: Reviewed and stable  Last Vitals:  Vitals Value Taken Time  BP 92/57 12/09/2017  5:20 PM  Temp    Pulse 63 12/09/2017  5:21 PM  Resp 14 12/09/2017  5:21 PM  SpO2 100 % 12/09/2017  5:21 PM  Vitals shown include unvalidated device data.  Last Pain:  Vitals:   12/09/17 1119  TempSrc:   PainSc: 7          Complications: No apparent anesthesia complications

## 2017-12-09 NOTE — Anesthesia Preprocedure Evaluation (Addendum)
Anesthesia Evaluation  Patient identified by MRN, date of birth, ID band Patient awake and Patient confused    Reviewed: Allergy & Precautions, NPO status , Patient's Chart, lab work & pertinent test results  History of Anesthesia Complications Negative for: history of anesthetic complications  Airway Mallampati: II  TM Distance: >3 FB Neck ROM: Full    Dental  (+) Chipped, Missing, Dental Advisory Given   Pulmonary former smoker (quit 1998),    breath sounds clear to auscultation       Cardiovascular hypertension, Pt. on medications (-) angina Rhythm:Regular Rate:Normal     Neuro/Psych PSYCHIATRIC DISORDERS (memory loss) Anxiety negative neurological ROS     GI/Hepatic negative GI ROS, Neg liver ROS,   Endo/Other  Hypothyroidism   Renal/GU negative Renal ROS     Musculoskeletal   Abdominal   Peds  Hematology negative hematology ROS (+)   Anesthesia Other Findings   Reproductive/Obstetrics                            Anesthesia Physical Anesthesia Plan  ASA: III  Anesthesia Plan: Spinal   Post-op Pain Management:    Induction:   PONV Risk Score and Plan: 2 and Treatment may vary due to age or medical condition, Ondansetron and Dexamethasone  Airway Management Planned: Nasal Cannula and Natural Airway  Additional Equipment:   Intra-op Plan:   Post-operative Plan:   Informed Consent: I have reviewed the patients History and Physical, chart, labs and discussed the procedure including the risks, benefits and alternatives for the proposed anesthesia with the patient or authorized representative who has indicated his/her understanding and acceptance.   Dental advisory given and Consent reviewed with POA  Plan Discussed with: Surgeon and CRNA  Anesthesia Plan Comments: (Plan routine monitors, SAB)        Anesthesia Quick Evaluation

## 2017-12-09 NOTE — Consult Note (Signed)
Medical Consultation   Terri Vasquez  ZOX:[id number]  DOB: Jan 28, 1941  DOA: 12/09/2017  PCP: Renford Dills, MD   Requesting physician: Gus Puma PA (orthopedics)  Reason for consultation: Management of hypertension and hypokalemia.  History of Present Illness: Terri Vasquez is an 77 y.o. female with a past medical history of dementia, hypertension, hypothyroidism, anxiety admitted to the hospital under orthopedic service.  Patient had a recent femoral neck fracture and underwent femoral neck pinning on October 09/21/2017.  Tried paged by orthopedics requesting management of patient's medical conditions including hypertension and hypokalemia. Patient was seen and examined at bedside.  She has baseline dementia (oriented to person only) and no history could be obtained from her.  I spoke to nursing staff and they confirmed that patient has baseline dementia and her mental status was the same before her hip surgery today.  Nursing staff also informed me that the patient's son also confirmed that the patient has dementia and symptoms are usually worse at night.  Review of Systems:  ROS As per HPI otherwise 10 point review of systems negative.     Past Medical History: Past Medical History:  Diagnosis Date  . Anxiety   . Essential hypertension 08/24/2017  . Family history of adverse reaction to anesthesia    sons have difficulty waking up  . Hypertension   . Hypothyroid 08/24/2017  . Hypothyroidism   . Thyroid disease   . Varicose veins     Past Surgical History: Past Surgical History:  Procedure Laterality Date  . foot surgeries     right foot on achilles tendon  . HIP PINNING,CANNULATED Right 08/23/2017   Procedure: CANNULATED HIP PINNING RIGHT;  Surgeon: Jodi Geralds, MD;  Location: [facility];  Service: Orthopedics;  Laterality: Right;  . NO PAST SURGERIES       Allergies:  No Known Allergies   Social History:  reports that she quit smoking about 21 years  ago. Her smoking use included cigarettes. She has never used smokeless tobacco. She reports that she does not drink alcohol or use drugs.   Family History: Family History  Problem Relation Age of Onset  . Cancer Mother   . Heart disease Mother   . Hyperlipidemia Mother   . Hypertension Mother     Physical Exam: Vitals:   12/09/17 1720 12/09/17 1735 12/09/17 1750 12/09/17 1805  BP: (!) 92/57 118/90 119/87 (!) 141/67  Pulse: 64 79 82 76  Resp: 13 20 (!) 21 (!) 21  Temp: 98.2 F (36.8 C)     TempSrc:      SpO2: 100% 100% 100% 100%  Weight:      Height:       Physical Exam  Constitutional: No distress.  Sitting up comfortably in a hospital bed  HENT:  Head: Normocephalic and atraumatic.  Eyes: Right eye exhibits no discharge. Left eye exhibits no discharge.  Neck: Neck supple. No tracheal deviation present.  Cardiovascular: Normal rate, regular rhythm and intact distal pulses.  Murmur heard. Pulmonary/Chest: Effort normal and breath sounds normal. No respiratory distress. She has no wheezes. She has no rales.  Abdominal: Soft. Bowel sounds are normal. She exhibits no distension. There is no tenderness.  Musculoskeletal: She exhibits edema.  +2 pitting edema of bilateral lower extremities  Neurological: She is alert.  Oriented to person only  Skin: Skin is warm and dry. She is not diaphoretic.  Data reviewed:  I have personally reviewed following labs and imaging studies Labs:  CBC: Recent Labs  Lab 12/03/17 1016  WBC 7.0  NEUTROABS 5.2  HGB 11.9*  HCT 37.9  MCV 89.4  PLT 315    Basic Metabolic Panel: Recent Labs  Lab 12/03/17 1016  NA 141  K 3.3*  CL 103  CO2 27  GLUCOSE 95  BUN 17  CREATININE 0.99  CALCIUM 9.2   GFR Estimated Creatinine Clearance: 54.3 mL/min (by C-G formula based on SCr of 0.99 mg/dL). Liver Function Tests: Recent Labs  Lab 12/03/17 1016  AST 18  ALT 15  ALKPHOS 67  BILITOT 0.5  PROT 7.3  ALBUMIN 3.6   No results  for input(s): LIPASE, AMYLASE in the last 168 hours. No results for input(s): AMMONIA in the last 168 hours. Coagulation profile Recent Labs  Lab 12/03/17 1016  INR 0.99    Cardiac Enzymes: No results for input(s): CKTOTAL, CKMB, CKMBINDEX, TROPONINI in the last 168 hours. BNP: Invalid input(s): POCBNP CBG: No results for input(s): GLUCAP in the last 168 hours. D-Dimer No results for input(s): DDIMER in the last 72 hours. Hgb A1c No results for input(s): HGBA1C in the last 72 hours. Lipid Profile No results for input(s): CHOL, HDL, LDLCALC, TRIG, CHOLHDL, LDLDIRECT in the last 72 hours. Thyroid function studies No results for input(s): TSH, T4TOTAL, T3FREE, THYROIDAB in the last 72 hours.  Invalid input(s): FREET3 Anemia work up No results for input(s): VITAMINB12, FOLATE, FERRITIN, TIBC, IRON, RETICCTPCT in the last 72 hours. Urinalysis    Component Value Date/Time   COLORURINE YELLOW 12/03/2017 1016   APPEARANCEUR CLEAR 12/03/2017 1016   APPEARANCEUR Clear 09/26/2016 0625   LABSPEC 1.009 12/03/2017 1016   PHURINE 6.0 12/03/2017 1016   GLUCOSEU NEGATIVE 12/03/2017 1016   HGBUR NEGATIVE 12/03/2017 1016   BILIRUBINUR NEGATIVE 12/03/2017 1016   BILIRUBINUR Negative 09/26/2016 0625   KETONESUR NEGATIVE 12/03/2017 1016   PROTEINUR NEGATIVE 12/03/2017 1016   NITRITE NEGATIVE 12/03/2017 1016   LEUKOCYTESUR TRACE (A) 12/03/2017 1016   LEUKOCYTESUR Negative 09/26/2016 0625     Microbiology Recent Results (from the past 240 hour(s))  Surgical pcr screen     Status: None   Collection Time: 12/03/17 10:38 AM  Result Value Ref Range Status   MRSA, PCR NEGATIVE NEGATIVE Final   Staphylococcus aureus NEGATIVE NEGATIVE Final    Comment: (NOTE) The Xpert SA Assay (FDA approved for NASAL specimens in patients 32 years of age and older), is one component of a comprehensive surveillance program. It is not intended to diagnose infection nor to guide or monitor  treatment. Performed at [facility], [address], [place], [place] [address]        Inpatient Medications:   Scheduled Meds: . chlorhexidine  60 mL Topical Once  . HYDROcodone-acetaminophen      . meperidine       Continuous Infusions: . lactated ringers 50 mL/hr at 12/09/17 1130     Radiological Exams on Admission: Dg C-arm 1-60 Min  Result Date: 12/09/2017 CLINICAL DATA:  Operative imaging. Removal of screws and addition of rt anterior hip hardware, images sent to pacs, 70.8 seconds fluoro EXAM: OPERATIVE right HIP (WITH PELVIS IF PERFORMED) 4 VIEWS TECHNIQUE: Fluoroscopic spot image(s) were submitted for interpretation post-operatively. COMPARISON:  None. FINDINGS: Initial image shows 3 screws crossing the intertrochanteric proximal femur into the femoral head. Follow-up imaging shows placement of a total right hip arthroplasty after removal of the screws and  resection of the femoral neck and head. The orthopedic hardware appears well seated and aligned. IMPRESSION: Well-positioned total right hip arthroplasty. Electronically Signed   By: Amie Portland M.D.   On: 12/09/2017 17:09   Dg C-arm 1-60 Min  Result Date: 12/09/2017 CLINICAL DATA:  Operative imaging. Removal of screws and addition of rt anterior hip hardware, images sent to pacs, 70.8 seconds fluoro EXAM: OPERATIVE right HIP (WITH PELVIS IF PERFORMED) 4 VIEWS TECHNIQUE: Fluoroscopic spot image(s) were submitted for interpretation post-operatively. COMPARISON:  None. FINDINGS: Initial image shows 3 screws crossing the intertrochanteric proximal femur into the femoral head. Follow-up imaging shows placement of a total right hip arthroplasty after removal of the screws and resection of the femoral neck and head. The orthopedic hardware appears well seated and aligned. IMPRESSION: Well-positioned total right hip arthroplasty. Electronically Signed   By: Amie Portland M.D.   On: 12/09/2017 17:09   Dg Hip Operative Unilat  W Or W/o Pelvis Right  Result Date: 12/09/2017 CLINICAL DATA:  Operative imaging. Removal of screws and addition of rt anterior hip hardware, images sent to pacs, 70.8 seconds fluoro EXAM: OPERATIVE right HIP (WITH PELVIS IF PERFORMED) 4 VIEWS TECHNIQUE: Fluoroscopic spot image(s) were submitted for interpretation post-operatively. COMPARISON:  None. FINDINGS: Initial image shows 3 screws crossing the intertrochanteric proximal femur into the femoral head. Follow-up imaging shows placement of a total right hip arthroplasty after removal of the screws and resection of the femoral neck and head. The orthopedic hardware appears well seated and aligned. IMPRESSION: Well-positioned total right hip arthroplasty. Electronically Signed   By: Amie Portland M.D.   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  No home medications other than Lasix noted on chart review.  -Start Amlodipine 5 mg daily -IV Hydralazine prn SBP >180 -Continue Lasix 40 mg daily.  She has +2 bilateral lower extremity edema.  Hyperlipidemia -Check lipid panel -Continue Lipitor 10 mg daily  Hypokalemia Potassium 3.3 on October 1.  It was low in June 2019 as well.  Likely related to lasix use. -Check BMP -Check Mag  Hypothyroidism -Check TSH -Continue Synthroid 125 mcg daily  Anemia Hemoglobin 11.9 on October 1 (at baseline). -Recheck CBC as patient underwent surgery today  Dementia  Currently oriented to person only with seems to be the baseline based on history provided by nursing staff.  Concern for sundowning symptoms seem to be worse in the evening. -Pain control    Thank you for this consultation.  Our [facility] hospitalist team will follow the patient with you.   Time Spent: 45 minutes.   John Giovanni M.D. [facility] 12/09/2017, 6:17 PM

## 2017-12-09 NOTE — Anesthesia Procedure Notes (Signed)
Spinal  Patient location during procedure: OR Start time: 12/09/2017 2:46 PM End time: 12/09/2017 2:50 PM Staffing Anesthesiologist: Leilani Able, MD Performed: anesthesiologist  Preanesthetic Checklist Completed: patient identified, site marked, surgical consent, pre-op evaluation, timeout performed, IV checked, risks and benefits discussed and monitors and equipment checked Spinal Block Patient position: sitting Prep: site prepped and draped and DuraPrep Patient monitoring: continuous pulse ox and blood pressure Approach: midline Location: L3-4 Injection technique: single-shot Needle Needle type: Pencan  Needle gauge: 24 G Needle length: 10 cm Needle insertion depth: 6 cm Assessment Sensory level: T6

## 2017-12-09 NOTE — Discharge Instructions (Signed)

## 2017-12-09 NOTE — Op Note (Signed)
NAME: TREINA, ARSCOTT MEDICAL RECORD ZO:[id number] ACCOUNT [id number] DATE OF BIRTH:01/17/1941 FACILITY: [facility] LOCATION: [facility] PHYSICIAN:Eloyce Bultman L. Laurine Kuyper, MD  OPERATIVE REPORT  DATE OF PROCEDURE:  12/09/2017  PREOPERATIVE DIAGNOSIS:  Status post femoral neck pinning with collapse and pain, right hip.  POSTOPERATIVE DIAGNOSIS:  Status post femoral neck pinning with collapse and pain, right hip.  PRINCIPAL PROCEDURE: 1.  Anterior total hip replacement performed through a previous operative field. 2.  Removal of deep hardware with femoral screws removed x3. 3.  Interpretation of multiple intraoperative fluoroscopic images.  SURGEON:  Jodi Geralds, MD  ASSISTANT:  Sophronia Simas, MD  ANESTHESIA:  General.  BRIEF HISTORY:  The patient is a 77 year old female with a history of having had a percutaneous pinning for femoral neck fracture.  She initially had done well, but unfortunately she began having significant collapse and I felt that it was just not  likely that she was going to go on to heal.  We had a long discussion with her family at length given her overall mental and physical health and should we proceed with additional surgical intervention.  It was certainly unclear what the most appropriate  course of action was, but I felt that she likely was going to be in pain if we did not fix her.  Ultimately, the family did wish to proceed, understanding the risks associated with that, and she was brought to the operating room for that procedure.   DESCRIPTION OF PROCEDURE:  The patient was brought to the operating room.  After adequate anesthesia was obtained with a spinal anesthetic, the patient was placed on the Hana bed and all bony prominences were well padded.  Attention was then turned to  the right hip.  After routine prep and drape, the fluoroscope was brought in, and under fluoroscopic guidance, we extended the incision that was made previously proximally and dissected down to the  screw heads.  We put guidewires individually into each  of the screws and removed the screws one at a time.  Once this was done, the attention was turned towards closure, the deep closure of the IT band, and then made the incision for an anterior approach to the hip as a separate incision, dissected down to  the tensor fascia was identified, opened in line with its fibers.  Finger dissected.  Retractors were put in place above and below the neck, and an incision was made in the capsule.  The capsule was opened and tagged.  At that point, I released the  anterior capsule.  I brought in fluoroscope just to make sure with her collapse that we were in the right place, and once I was certain of the neck cut, the neck cut was made provisionally.  The femoral head was removed.  At that point, it was clear that  there was a fracture pattern.  I did not see penetration, but there had been wallowing in that posterior aspect of the head.  Once that was done, the head was removed and retractors put in place above and below the acetabulum.  It was sequentially  reamed to a level of 51 mm, and a 52 mm cup was placed, followed by a +4 neutral liner.  This was in 45 degrees of lateral opening, 30 degrees of anteversion under fluoroscopic guidance.  A hole eliminator was placed before the final poly.  It was a  porous coated 52 mm cup with no holes.  Once this was done, attention was turned to the  stem side where the hip was externally rotated, extended and then adducted.  We then very gently and carefully released the posterior capsule.  We then carefully  opened the posterior cortex along the greater trochanter.  This was done with a cookie cutter followed by a chili pepper followed by the broaching size of 8-10.  I stopped at 10 just to get a sense of where we were.  I checked the length, checked the  sizing.  I felt like we could get a little more lateral, so we carefully dissected a little further laterally, and the  trochanter rasped up to a 12.  We got a great fit here.  Opened and incised the 12 at that point.  Started trialing with a +8 as that  looked like what we would need for length.  It turned out to be dead on.  We then opened the +8 ball and placed it, and the hip was then reduced.  The capsule was closed with #1 Vicryl running, skin with 0 and 2-0 Vicryl and 3-0 and then skin staples for  the skin.  A sterile compressive dressing was applied here.  The lateral wound was now closed with 2-0 Vicryl and staples as well.  A sterile compressive dressing was applied there.  At this point, the patient was taken to recovery and was noted to be  in satisfactory condition.  Estimated blood loss for the procedure was probably 350 mL.  The final can be gotten from the anesthetic record.  LN/NUANCE  D:12/09/2017 T:12/09/2017 JOB:[id number]

## 2017-12-09 NOTE — H&P (Signed)
TOTAL HIP ADMISSION H&P  Patient is admitted for right total hip arthroplasty.  Subjective:  Chief Complaint: right hip pain  HPI: Terri Vasquez, 76 y.o. female, has a history of pain and functional disability in the right hip(s) due to arthritis and patient has failed non-surgical conservative treatments for greater than 12 weeks to include NSAID's and/or analgesics, weight reduction as appropriate and activity modification.  Onset of symptoms was abrupt starting 1 years ago with rapidlly worsening course since that time.The patient noted Hip pinning right side which is gone on to collapse on the right hip(s).  Patient currently rates pain in the right hip at 8 out of 10 with activity. Patient has night pain, worsening of pain with activity and weight bearing, trendelenberg gait and joint swelling. Patient has evidence of Collapse of right hip pending by imaging studies. This condition presents safety issues increasing the risk of falls. This patient has had Failure of right hip pinning.  There is no current active infection.  Patient Active Problem List   Diagnosis Date Noted  . Essential hypertension 08/24/2017  . Hypothyroid 08/24/2017  . AKI (acute kidney injury) ([place]) 08/24/2017  . Hip fracture ([place]) 08/23/2017  . Fracture of femoral neck, right ([place]) 08/23/2017  . Memory loss 10/01/2016  . Varicose veins of lower extremities with other complications 14/48/1856   Past Medical History:  Diagnosis Date  . Anxiety   . Essential hypertension 08/24/2017  . Family history of adverse reaction to anesthesia    sons have difficulty waking up  . Hypertension   . Hypothyroid 08/24/2017  . Hypothyroidism   . Thyroid disease   . Varicose veins     Past Surgical History:  Procedure Laterality Date  . foot surgeries     right foot on achilles tendon  . HIP PINNING,CANNULATED Right 08/23/2017   Procedure: CANNULATED HIP PINNING RIGHT;  Surgeon: Dorna Leitz, MD;  Location: [facility];  Service:  Orthopedics;  Laterality: Right;  . NO PAST SURGERIES      Current Facility-Administered Medications  Medication Dose Route Frequency Provider Last Rate Last Dose  . bupivacaine liposome (EXPAREL) 1.3 % injection 133 mg  10 mL Infiltration Once Dorna Leitz, MD      . ceFAZolin (ANCEF) IVPB 2g/100 mL premix  2 g Intravenous On Call to OR Dorna Leitz, MD      . chlorhexidine (HIBICLENS) 4 % liquid 4 application  60 mL Topical Once Dorna Leitz, MD      . HYDROcodone-acetaminophen (NORCO/VICODIN) 5-325 MG per tablet           . lactated ringers infusion   Intravenous Continuous Annye Asa, MD 50 mL/hr at 12/09/17 1130    . tranexamic acid (CYKLOKAPRON) 1,000 mg in sodium chloride 0.9 % 100 mL IVPB  1,000 mg Intravenous To OR Dorna Leitz, MD       No Known Allergies  Social History   Tobacco Use  . Smoking status: Former Smoker    Types: Cigarettes    Last attempt to quit: 07/14/1996    Years since quitting: 21.4  . Smokeless tobacco: Never Used  Substance Use Topics  . Alcohol use: No    Family History  Problem Relation Age of Onset  . Cancer Mother   . Heart disease Mother   . Hyperlipidemia Mother   . Hypertension Mother      ROS ROS: I have reviewed the patient's review of systems thoroughly and there are no positive responses as relates to  the HPI. Objective:  Physical Exam  Vital signs in last 24 hours: Temp:  [98.4 F (36.9 C)] 98.4 F (36.9 C) (10/07 1104) Pulse Rate:  [71] 71 (10/07 1104) Resp:  [18] 18 (10/07 1104) BP: (175)/(75) 175/75 (10/07 1104) SpO2:  [100 %] 100 % (10/07 1104) Weight:  [82.1 kg] 82.1 kg (10/07 1104) Well-developed well-nourished patient in no acute distress. Alert and oriented x3 HEENT:within normal limits Cardiac: Regular rate and rhythm Pulmonary: Lungs clear to auscultation Abdomen: Soft and nontender.  Normal active bowel sounds  Musculoskeletal: Right hip: Painful range of motion.  Limited range of motion.  Prominent  screws laterally. Labs: Recent Results (from the past 2160 hour(s))  APTT     Status: None   Collection Time: 12/03/17 10:16 AM  Result Value Ref Range   aPTT 31 24 - 36 seconds    Comment: Performed at [facility] [address], [place], [place] [address]  CBC WITH DIFFERENTIAL     Status: Abnormal   Collection Time: 12/03/17 10:16 AM  Result Value Ref Range   WBC 7.0 4.0 - 10.5 K/uL   RBC 4.24 3.87 - 5.11 MIL/uL    Comment: CORRECTED FOR COLD AGGLUTININS   Hemoglobin 11.9 (L) 12.0 - 15.0 g/dL   HCT 37.9 36.0 - 46.0 %   MCV 89.4 78.0 - 100.0 fL   MCH 28.1 26.0 - 34.0 pg    Comment: CORRECTED FOR COLD AGGLUTININS   MCHC 31.4 30.0 - 36.0 g/dL    Comment: CORRECTED FOR COLD AGGLUTININS   RDW 13.8 11.5 - 15.5 %   Platelets 315 150 - 400 K/uL   Neutrophils Relative % 74 %   Neutro Abs 5.2 1.7 - 7.7 K/uL   Lymphocytes Relative 15 %   Lymphs Abs 1.0 0.7 - 4.0 K/uL   Monocytes Relative 8 %   Monocytes Absolute 0.6 0.1 - 1.0 K/uL   Eosinophils Relative 2 %   Eosinophils Absolute 0.1 0.0 - 0.7 K/uL   Basophils Relative 1 %   Basophils Absolute 0.1 0.0 - 0.1 K/uL   Immature Granulocytes 0 %   Abs Immature Granulocytes 0.0 0.0 - 0.1 K/uL    Comment: Performed at [facility], [address] [place], [place] [address]  Comprehensive metabolic panel     Status: Abnormal   Collection Time: 12/03/17 10:16 AM  Result Value Ref Range   Sodium 141 135 - 145 mmol/L   Potassium 3.3 (L) 3.5 - 5.1 mmol/L   Chloride 103 98 - 111 mmol/L   CO2 27 22 - 32 mmol/L   Glucose, Bld 95 70 - 99 mg/dL   BUN 17 8 - 23 mg/dL   Creatinine, Ser 0.99 0.44 - 1.00 mg/dL   Calcium 9.2 8.9 - 10.3 mg/dL   Total Protein 7.3 6.5 - 8.1 g/dL   Albumin 3.6 3.5 - 5.0 g/dL   AST 18 15 - 41 U/L   ALT 15 0 - 44 U/L   Alkaline Phosphatase 67 38 - 126 U/L   Total Bilirubin 0.5 0.3 - 1.2 mg/dL   GFR calc non Af Amer 54 (L) >60 mL/min   GFR calc Af Amer >60 >60 mL/min    Comment: (NOTE) The eGFR has  been calculated using the CKD EPI equation. This calculation has not been validated in all clinical situations. eGFR's persistently <60 mL/min signify possible Chronic Kidney Disease.    Anion gap 11 5 - 15    Comment: Performed  at [facility], [place] [address], [place], [place] [address]  Protime-INR     Status: None   Collection Time: 12/03/17 10:16 AM  Result Value Ref Range   Prothrombin Time 13.0 11.4 - 15.2 seconds   INR 0.99     Comment: Performed at [facility] [address] [place], [place] [address]  Urinalysis, Routine w reflex microscopic     Status: Abnormal   Collection Time: 12/03/17 10:16 AM  Result Value Ref Range   Color, Urine YELLOW YELLOW   APPearance CLEAR CLEAR   Specific Gravity, Urine 1.009 1.005 - 1.030   pH 6.0 5.0 - 8.0   Glucose, UA NEGATIVE NEGATIVE mg/dL   Hgb urine dipstick NEGATIVE NEGATIVE   Bilirubin Urine NEGATIVE NEGATIVE   Ketones, ur NEGATIVE NEGATIVE mg/dL   Protein, ur NEGATIVE NEGATIVE mg/dL   Nitrite NEGATIVE NEGATIVE   Leukocytes, UA TRACE (A) NEGATIVE   RBC / HPF 0-5 0 - 5 RBC/hpf   WBC, UA 0-5 0 - 5 WBC/hpf   Bacteria, UA MANY (A) NONE SEEN    Comment: Performed at [facility] [address], [place], [place] [address]  Type and screen Order type and screen if day of surgery is less than 15 days from draw of preadmission visit or order morning of surgery if day of surgery is greater than 6 days from preadmission visit.     Status: None   Collection Time: 12/03/17 10:30 AM  Result Value Ref Range   ABO/RH(D) A POS    Antibody Screen NEG    Sample Expiration 12/17/2017    Extend sample reason      NO TRANSFUSIONS OR PREGNANCY IN THE PAST 3 MONTHS Performed at [facility], [place] [address], [place], [place] [address]   ABO/Rh     Status: None   Collection Time: 12/03/17 10:30 AM  Result Value Ref Range   ABO/RH(D)      A POS Performed at [facility] [address] [place], [place] [address]    Surgical pcr screen     Status: None   Collection Time: 12/03/17 10:38 AM  Result Value Ref Range   MRSA, PCR NEGATIVE NEGATIVE   Staphylococcus aureus NEGATIVE NEGATIVE    Comment: (NOTE) The Xpert SA Assay (FDA approved for NASAL specimens in patients 31 years of age and older), is one component of a comprehensive surveillance program. It is not intended to diagnose infection nor to guide or monitor treatment. Performed at [facility], [place] [address], [place], [place] [address]     Estimated body mass index is 27.52 kg/m as calculated from the following:   Height as of this encounter: '5\' 8"'$  (1.727 m).   Weight as of this encounter: 82.1 kg.   Imaging Review Plain radiographs demonstrate moderate degenerative joint disease of the right hip(s). The bone quality appears to be fair for age and reported activity level.  There is collapse of her right hip pinning with prominent screws    Preoperative templating of the joint replacement has been completed, documented, and submitted to the Operating Room personnel in order to optimize intra-operative equipment management.     Assessment/Plan:  End stage arthritis, right hip(s)  The patient history, physical examination, clinical judgement of the provider and imaging studies are consistent with end stage degenerative joint disease of the right hip(s) and total hip arthroplasty is deemed medically necessary. The treatment options including medical  management, injection therapy, arthroscopy and arthroplasty were discussed at length. The risks and benefits of total hip arthroplasty were presented and reviewed. The risks due to aseptic loosening, infection, stiffness, dislocation/subluxation,  thromboembolic complications and other imponderables were discussed.  The patient acknowledged the explanation, agreed to proceed with the plan and consent was signed. Patient is being admitted for inpatient treatment for surgery, pain control,  PT, OT, prophylactic antibiotics, VTE prophylaxis, progressive ambulation and ADL's and discharge planning.The patient is planning to be discharged to skilled nursing facility

## 2017-12-10 ENCOUNTER — Encounter (HOSPITAL_COMMUNITY): Payer: Self-pay | Admitting: Orthopedic Surgery

## 2017-12-10 DIAGNOSIS — I10 Essential (primary) hypertension: Secondary | ICD-10-CM

## 2017-12-10 DIAGNOSIS — E876 Hypokalemia: Secondary | ICD-10-CM

## 2017-12-10 LAB — LIPID PANEL
CHOL/HDL RATIO: 3.2 ratio
Cholesterol: 118 mg/dL (ref 0–200)
HDL: 37 mg/dL — ABNORMAL LOW (ref >40)
LDL CALC: 70 mg/dL (ref 0–99)
Triglycerides: 57 mg/dL (ref <150)
VLDL: 11 mg/dL (ref 0–40)

## 2017-12-10 LAB — BASIC METABOLIC PANEL
Anion gap: 9 (ref 5–15)
BUN: 12 mg/dL (ref 8–23)
CHLORIDE: 105 mmol/L (ref 98–111)
CO2: 23 mmol/L (ref 22–32)
CREATININE: 0.93 mg/dL (ref 0.44–1.00)
Calcium: 8.6 mg/dL — ABNORMAL LOW (ref 8.9–10.3)
GFR calc Af Amer: >60 mL/min (ref >60)
GFR, EST NON AFRICAN AMERICAN: 58 mL/min — AB (ref >60)
Glucose, Bld: 165 mg/dL — ABNORMAL HIGH (ref 70–99)
Potassium: 3 mmol/L — ABNORMAL LOW (ref 3.5–5.1)
SODIUM: 137 mmol/L (ref 135–145)

## 2017-12-10 LAB — CBC
HCT: 30.1 % — ABNORMAL LOW (ref 36.0–46.0)
HEMOGLOBIN: 10.2 g/dL — AB (ref 12.0–15.0)
MCH: 30.5 pg (ref 26.0–34.0)
MCHC: 33.9 g/dL (ref 30.0–36.0)
MCV: 90.1 fL (ref 80.0–100.0)
Platelets: 300 10*3/uL (ref 150–400)
RBC: 3.34 MIL/uL — ABNORMAL LOW (ref 3.87–5.11)
RDW: 13.7 % (ref 11.5–15.5)
WBC: 12.2 10*3/uL — ABNORMAL HIGH (ref 4.0–10.5)

## 2017-12-10 LAB — TSH: TSH: 3.404 u[IU]/mL (ref 0.350–4.500)

## 2017-12-10 LAB — MAGNESIUM: MAGNESIUM: 1.6 mg/dL — AB (ref 1.7–2.4)

## 2017-12-10 MED ORDER — AMLODIPINE BESYLATE 5 MG PO TABS
5.0000 mg | ORAL_TABLET | Freq: Every day | ORAL | Status: DC
  Administered 2017-12-10 – 2017-12-12 (×3): 5 mg via ORAL
  Filled 2017-12-10 (×3): qty 1

## 2017-12-10 MED ORDER — HYDRALAZINE HCL 20 MG/ML IJ SOLN: 5.0000 mg | Freq: Three times a day (TID) | INTRAMUSCULAR | Status: DC | PRN

## 2017-12-10 MED ORDER — MAGNESIUM SULFATE 2 GM/50ML IV SOLN
2.0000 g | Freq: Once | INTRAVENOUS | Status: AC
  Administered 2017-12-10: 2 g via INTRAVENOUS
  Filled 2017-12-10: qty 50

## 2017-12-10 MED ORDER — PROSIGHT PO TABS
1.0000 | ORAL_TABLET | Freq: Every day | ORAL | Status: DC
  Administered 2017-12-10 – 2017-12-12 (×3): 1 via ORAL
  Filled 2017-12-10 (×3): qty 1

## 2017-12-10 MED ORDER — POTASSIUM CHLORIDE CRYS ER 20 MEQ PO TBCR
60.0000 meq | EXTENDED_RELEASE_TABLET | Freq: Once | ORAL | Status: AC
  Administered 2017-12-10: 60 meq via ORAL
  Filled 2017-12-10: qty 3

## 2017-12-10 NOTE — Plan of Care (Signed)

## 2017-12-10 NOTE — Progress Notes (Signed)
CSW received call back from patient's son regarding SNF options. Son reports he is not ready to make decision at this time as he is continuing to work with patient's ALF [facility] in identifying if it would be allowed for home health services to go there for PT, as family is Educational psychologist for patient. Son was unable to give me options for SNFs to work on at this time although CSW urged decision asap due to insurance authorization purposes. Son said he will call back, when asked an estimated time of call back he was unable to provide one.   CSW will continue to follow up.   [place], [place] [phone]

## 2017-12-10 NOTE — Progress Notes (Signed)
Patient is confused at baseline. Multiple attempts to get out of bed unassisted. Reoriented but continues to attempt to get out of bed. Fall mat beside bed. Bed alarm on. Provider notified and new order received for a sitter. Will cont to monitor.

## 2017-12-10 NOTE — Plan of Care (Signed)
  Problem: Education: Goal: Knowledge of General Education information will improve Description: Including pain rating scale, medication(s)/side effects and non-pharmacologic comfort measures Outcome: Progressing   Problem: Activity: Goal: Risk for activity intolerance will decrease Outcome: Progressing   Problem: Elimination: Goal: Will not experience complications related to urinary retention Outcome: Progressing   Problem: Pain Managment: Goal: General experience of comfort will improve Outcome: Progressing   Problem: Safety: Goal: Ability to remain free from injury will improve Outcome: Progressing   

## 2017-12-10 NOTE — Progress Notes (Signed)
CSW spoke with Darren regarding SNF options who reported [facility] was point of contact for decisions. CSW called [facility] who did not answer, voicemail was left. [facility] however [facility] were [facility] with rehab there and will paying for a 24/7 sitter to accompany patient when discharged.   [place], [place] [phone]

## 2017-12-10 NOTE — Evaluation (Signed)
Physical Therapy Evaluation Patient Details Name: Terri Vasquez MRN: [id number] DOB: Jul 14, 1940 Today's Date: 12/10/2017   History of Present Illness  77 y.o. female admitted on 12/09/17 after fall with R hip fx s/p R hip hemiarthroplasty direct anterior, WBAT post op.  Pt with significant h/o dementia, HTN, anxiety, R hip pinning (08/2017).   Clinical Impression  Pt was able to get up and walk a short distance into the hallway with min assist and RW.  She is completely unaware of her situation.  I saw notes in the chart that family preference is for her to return to ALF for therapy there with 24/7 hired assistance.  If this is the case, she would be ok to d/c to ALF where she will be in her familiar environment, but she has to have 24/7 hands-on assistance.  PT to follow acutely for deficits listed below.      Follow Up Recommendations Home health PT;Supervision/Assistance - 24 hour    Equipment Recommendations  Rolling walker with 5" wheels;3in1 (PT);Other (comment)(unsure of what she already has)    Recommendations for Other Services   NA    Precautions / Restrictions Precautions Precautions: Fall Restrictions RLE Weight Bearing: Weight bearing as tolerated      Mobility  Bed Mobility Overal bed mobility: Needs Assistance Bed Mobility: Supine to Sit;Sit to Supine     Supine to sit: Min assist     General bed mobility comments: Min assist to help progress R leg to EOB.    Transfers Overall transfer level: Needs assistance   Transfers: Sit to/from Stand Sit to Stand: Min assist         General transfer comment: Min assist to support trunk during transitions.   Ambulation/Gait Ambulation/Gait assistance: Min assist Gait Distance (Feet): 65 Feet Assistive device: Rolling walker (2 wheeled) Gait Pattern/deviations: Step-through pattern;Antalgic     General Gait Details: Pt with moderately antalgic gait pattern, min assist for safety and balance, as well as manual  assistance to help steer RW.          Balance Overall balance assessment: Needs assistance Sitting-balance support: Feet supported;No upper extremity supported Sitting balance-Leahy Scale: Good     Standing balance support: Bilateral upper extremity supported Standing balance-Leahy Scale: Poor                               Pertinent Vitals/Pain Pain Assessment: Faces Faces Pain Scale: Hurts even more Pain Location: right leg Pain Descriptors / Indicators: Grimacing;Guarding Pain Intervention(s): Limited activity within patient's tolerance;Monitored during session;Repositioned    Home Living Family/patient expects to be discharged to:: Private residence Living Arrangements: Alone Available Help at Discharge: Family;Available 24 hours/day;Personal care attendant Type of Home: Assisted living Home Access: Level entry     Home Layout: One level   Additional Comments: Pt is unable to report accurate history    Prior Function           Comments: Pt is unable to report accurate history and no family present to report.  Pt did have a hip fx in June of this year, not sure if she was still using an AD for gait.         Extremity/Trunk Assessment   Upper Extremity Assessment Upper Extremity Assessment: Overall WFL for tasks assessed    Lower Extremity Assessment Lower Extremity Assessment: RLE deficits/detail RLE Deficits / Details: right leg with normal post op pain and weakness, ankle at  least 3/5, knee extension 3-/5, hip 2/5    Cervical / Trunk Assessment Cervical / Trunk Assessment: Normal  Communication   Communication: No difficulties  Cognition Arousal/Alertness: Awake/alert Behavior During Therapy: Restless;Impulsive Overall Cognitive Status: History of cognitive impairments - at baseline                                 General Comments: Pt is restless, trying to get up OOB, sitter in room keeping her contained.  Pt doesn't  know where she is or her current situation, no one present who can report if this is normal cognition compared to her baseline.           Exercises Total Joint Exercises Ankle Circles/Pumps: AAROM;Both;20 reps Heel Slides: AAROM;Right;10 reps Hip ABduction/ADduction: AAROM;Right;10 reps   Assessment/Plan    PT Assessment Patient needs continued PT services  PT Problem List Decreased strength;Decreased activity tolerance;Decreased range of motion;Decreased balance;Decreased mobility;Decreased cognition;Decreased knowledge of use of DME;Decreased safety awareness;Decreased knowledge of precautions;Pain       PT Treatment Interventions DME instruction;Gait training;Functional mobility training;Therapeutic exercise;Therapeutic activities;Balance training;Patient/family education;Manual techniques;Modalities    PT Goals (Current goals can be found in the Care Plan section)  Acute Rehab PT Goals Patient Stated Goal: unable to state, wants to get OOB PT Goal Formulation: With patient Time For Goal Achievement: 12/17/17 Potential to Achieve Goals: Good    Frequency Min 5X/week           AM-PAC PT "6 Clicks" Daily Activity  Outcome Measure Difficulty turning over in bed (including adjusting bedclothes, sheets and blankets)?: Unable Difficulty moving from lying on back to sitting on the side of the bed? : Unable Difficulty sitting down on and standing up from a chair with arms (e.g., wheelchair, bedside commode, etc,.)?: Unable Help needed moving to and from a bed to chair (including a wheelchair)?: A Little Help needed walking in hospital room?: A Little Help needed climbing 3-5 steps with a railing? : A Little 6 Click Score: 12    End of Session Equipment Utilized During Treatment: Gait belt Activity Tolerance: Patient tolerated treatment well Patient left: in chair;with call bell/phone within reach;with nursing/sitter in room   PT Visit Diagnosis: Muscle weakness (generalized)  (M62.81);Difficulty in walking, not elsewhere classified (R26.2);Pain Pain - Right/Left: Right Pain - part of body: Leg    Time: 1610-9604 PT Time Calculation (min) (ACUTE ONLY): 16 min   Charges:       Lurena Joiner B. Gerome Kokesh, PT, DPT  Acute Rehabilitation #[phone] pager #[phone] office   PT Evaluation $PT Eval Moderate Complexity: 1 Mod          12/10/2017, 3:40 PM

## 2017-12-10 NOTE — Progress Notes (Signed)
PROGRESS NOTE  Terri Vasquez ZOX:[id number] DOB: 11/17/1940 DOA: 12/09/2017 PCP: Renford Dills, MD  HPI/Recap of past 24 hours: HPI from Dr Octavio Graves is a 77 y.o. female with past medical history of dementia, hypertension, hypothyroidism, anxiety admitted to the hospital under orthopedic service.  Patient had a recent femoral neck fracture and underwent femoral neck pinning on October 09/2017. TRH on consult for the management hypertension and hypokalemia.   Today, pt denies any pain. Alert, awake, oriented to self only.  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 if still uncontrolled, continue Lasix 40 mg daily IV hydralazine PRN Stop IV fluids  Hypokalemia/hypomagnesemia Replace PRN  Leukocytosis Likely reactive secondary to surgery Daily CBC  Normocytic anemia Likely dilutional, status post surgery Daily CBC  Hyperlipidemia LDL 70 Continue statins  Hypothyroidism Continue Synthroid  Dementia At baseline     Code Status: DNR  Family Communication: None at bedside  Disposition Plan: Likely SNF   Consultants:  Orthopedics  Procedures:  Femoral neck pain on 12/09/2017  Antimicrobials:  None  DVT prophylaxis: ASA 325 mg twice daily as per Ortho   Objective: Vitals:   12/09/17 2123 12/09/17 2130 12/10/17 0006 12/10/17 0533  BP: (!) 170/78  (!) 147/77 (!) 162/83  Pulse: 88  90 92  Resp: 16  16 14   Temp: 98.1 F (36.7 C)  98.5 F (36.9 C) (!) 97.3 F (36.3 C)  TempSrc: Oral  Oral Oral  SpO2: 100% 97% 96% 97%  Weight:  82.1 kg    Height:        Intake/Output Summary (Last 24 hours) at 12/10/2017  1445 Last data filed at 12/10/2017 1115 Gross per 24 hour  Intake 1400 ml  Output 2350 ml  Net -950 ml   Filed Weights   12/09/17 1104 12/09/17 2130  Weight: 82.1 kg 82.1 kg    Exam:   General: NAD  Cardiovascular: S1, S2 present  Respiratory: CTA B  Abdomen: Soft, nontender, nondistended, bowel sounds present  Musculoskeletal: Right hip incision C/D/I, +2 BLE edema  Skin: Normal  Psychiatry: Unable to assess   Data Reviewed: CBC: Recent Labs  Lab 12/10/17 0019  WBC 12.2*  HGB 10.2*  HCT 30.1*  MCV 90.1  PLT 300   Basic Metabolic Panel: Recent Labs  Lab 12/10/17 0019  NA 137  K 3.0*  CL 105  CO2 23  GLUCOSE 165*  BUN 12  CREATININE 0.93  CALCIUM 8.6*  MG 1.6*   GFR: Estimated Creatinine Clearance: 57.8 mL/min (by C-G formula based on SCr of 0.93 mg/dL). Liver Function Tests: No results for input(s): AST, ALT, ALKPHOS, BILITOT, PROT, ALBUMIN in the last 168 hours. No results for input(s): LIPASE, AMYLASE in the last 168 hours. No results for input(s): AMMONIA in the last 168 hours. Coagulation Profile: No results for input(s): INR, PROTIME in the last 168 hours. Cardiac Enzymes: No results for input(s): CKTOTAL, CKMB, CKMBINDEX, TROPONINI in the last 168 hours. BNP (last 3 results) No results for input(s): PROBNP in the last 8760 hours. HbA1C: No results for input(s): HGBA1C in the last 72 hours. CBG: No results for input(s): GLUCAP in the last 168 hours. Lipid Profile:  Recent Labs    12/10/17 0019  CHOL 118  HDL 37*  LDLCALC 70  TRIG 57  CHOLHDL 3.2   Thyroid Function Tests: Recent Labs    12/10/17 0019  TSH 3.404   Anemia Panel: No results for input(s): VITAMINB12, FOLATE, FERRITIN, TIBC, IRON, RETICCTPCT in the last 72 hours. Urine analysis:    Component Value Date/Time   COLORURINE YELLOW 12/03/2017 1016   APPEARANCEUR CLEAR 12/03/2017 1016   APPEARANCEUR Clear 09/26/2016 0625   LABSPEC 1.009 12/03/2017 1016   PHURINE  6.0 12/03/2017 1016   GLUCOSEU NEGATIVE 12/03/2017 1016   HGBUR NEGATIVE 12/03/2017 1016   BILIRUBINUR NEGATIVE 12/03/2017 1016   BILIRUBINUR Negative 09/26/2016 0625   KETONESUR NEGATIVE 12/03/2017 1016   PROTEINUR NEGATIVE 12/03/2017 1016   NITRITE NEGATIVE 12/03/2017 1016   LEUKOCYTESUR TRACE (A) 12/03/2017 1016   LEUKOCYTESUR Negative 09/26/2016 0625   Sepsis Labs: @LABRCNTIP (procalcitonin:4,lacticidven:4)  ) Recent Results (from the past 240 hour(s))  Surgical pcr screen     Status: None   Collection Time: 12/03/17 10:38 AM  Result Value Ref Range Status   MRSA, PCR NEGATIVE NEGATIVE Final   Staphylococcus aureus NEGATIVE NEGATIVE Final    Comment: (NOTE) The Xpert SA Assay (FDA approved for NASAL specimens in patients 29 years of age and older), is one component of a comprehensive surveillance program. It is not intended to diagnose infection nor to guide or monitor treatment. Performed at [facility], [address] [place], [place] [address]       Studies: Dg C-arm 1-60 Min  Result Date: 12/09/2017 CLINICAL DATA:  Operative imaging. Removal of screws and addition of rt anterior hip hardware, images sent to pacs, 70.8 seconds fluoro EXAM: OPERATIVE right HIP (WITH PELVIS IF PERFORMED) 4 VIEWS TECHNIQUE: Fluoroscopic spot image(s) were submitted for interpretation post-operatively. COMPARISON:  None. FINDINGS: Initial image shows 3 screws crossing the intertrochanteric proximal femur into the femoral head. Follow-up imaging shows placement of a total right hip arthroplasty after removal of the screws and resection of the femoral neck and head. The orthopedic hardware appears well seated and aligned. IMPRESSION: Well-positioned total right hip arthroplasty. Electronically Signed   By: Amie Portland M.D.   On: 12/09/2017 17:09   Dg C-arm 1-60 Min  Result Date: 12/09/2017 CLINICAL DATA:  Operative imaging. Removal of screws and addition of rt anterior hip hardware,  images sent to pacs, 70.8 seconds fluoro EXAM: OPERATIVE right HIP (WITH PELVIS IF PERFORMED) 4 VIEWS TECHNIQUE: Fluoroscopic spot image(s) were submitted for interpretation post-operatively. COMPARISON:  None. FINDINGS: Initial image shows 3 screws crossing the intertrochanteric proximal femur into the femoral head. Follow-up imaging shows placement of a total right hip arthroplasty after removal of the screws and resection of the femoral neck and head. The orthopedic hardware appears well seated and aligned. IMPRESSION: Well-positioned total right hip arthroplasty. Electronically Signed   By: Amie Portland M.D.   On: 12/09/2017 17:09   Dg Hip Unilat With Pelvis 1v Right  Result Date: 12/09/2017 CLINICAL DATA:  77 year old female post right hip replacement. Subsequent encounter. EXAM: DG HIP (WITH OR WITHOUT PELVIS) 1V RIGHT COMPARISON:  12/09/2017 intraoperative exams. FINDINGS: Post right total hip replacement which appears in satisfactory position without complication noted on frontal view. Bony overgrowth inferior to right greater trochanter felt to be related to pin track (pins have been removed). IMPRESSION: Post total right hip replacement. Electronically Signed   By: Lacy Duverney M.D.   On: 12/09/2017 18:56   Dg Hip  Operative Unilat W Or W/o Pelvis Right  Result Date: 12/09/2017 CLINICAL DATA:  Operative imaging. Removal of screws and addition of rt anterior hip hardware, images sent to pacs, 70.8 seconds fluoro EXAM: OPERATIVE right HIP (WITH PELVIS IF PERFORMED) 4 VIEWS TECHNIQUE: Fluoroscopic spot image(s) were submitted for interpretation post-operatively. COMPARISON:  None. FINDINGS: Initial image shows 3 screws crossing the intertrochanteric proximal femur into the femoral head. Follow-up imaging shows placement of a total right hip arthroplasty after removal of the screws and resection of the femoral neck and head. The orthopedic hardware appears well seated and aligned. IMPRESSION:  Well-positioned total right hip arthroplasty. Electronically Signed   By: Amie Portland M.D.   On: 12/09/2017 17:09    Scheduled Meds: . amLODipine  5 mg Oral Daily  . aspirin EC  325 mg Oral BID PC  . atorvastatin  10 mg Oral Daily  . celecoxib  200 mg Oral Daily  . dexamethasone  10 mg Intravenous Q12H  . docusate sodium  100 mg Oral BID  . ferrous sulfate  325 mg Oral Q lunch  . furosemide  40 mg Oral Daily  . levothyroxine  125 mcg Oral QAC breakfast  . Melatonin  1 tablet Oral QHS  . multivitamin  1 tablet Oral Daily    Continuous Infusions: . sodium chloride 100 mL/hr at 12/09/17 2219  . methocarbamol (ROBAXIN) IV       LOS: 1 day     Briant Cedar, MD [facility]   If 7PM-7AM, please contact night-coverage [email or web address] 12/10/2017, 2:45 PM

## 2017-12-10 NOTE — Progress Notes (Signed)
Subjective: 1 Day Post-Op Procedure(s) (LRB): RIGHT TOTAL HIP ARTHROPLASTY ANTERIOR APPROACH (Right) HARDWARE REMOVAL (Right) Patient reports pain as mild.  Patient with no complaints.  She is at her usual mental status.  Foley catheter still in place.  Seen by hospitalist yesterday for medical management.  Objective: Vital signs in last 24 hours: Temp:  [97.3 F (36.3 C)-98.5 F (36.9 C)] 97.3 F (36.3 C) (10/08 0533) Pulse Rate:  [64-94] 92 (10/08 0533) Resp:  [13-29] 14 (10/08 0533) BP: (92-175)/(57-99) 162/83 (10/08 0533) SpO2:  [96 %-100 %] 97 % (10/08 0533) Weight:  [82.1 kg] 82.1 kg (10/07 2130)  Intake/Output from previous day: 10/07 0701 - 10/08 0700 In: 1400 [I.V.:1400] Out: 2100 [Urine:1950; Blood:150] Intake/Output this shift: No intake/output data recorded.  Recent Labs    12/10/17 0019  HGB 10.2*   Recent Labs    12/10/17 0019  WBC 12.2*  RBC 3.34*  HCT 30.1*  PLT 300   Recent Labs    12/10/17 0019  NA 137  K 3.0*  CL 105  CO2 23  BUN 12  CREATININE 0.93  GLUCOSE 165*  CALCIUM 8.6*   No results for input(s): LABPT, INR in the last 72 hours. Right hip exam: Neurovascular intact Sensation intact distally Intact pulses distally Dorsiflexion/Plantar flexion intact Incision: dressing C/D/I Compartment soft Patient usual mental status with pleasant dementia.   Assessment/Plan: 1 Day Post-Op Procedure(s) (LRB): RIGHT TOTAL HIP ARTHROPLASTY ANTERIOR APPROACH (Right) HARDWARE REMOVAL (Right) History of dementia. Plan: Up with physical therapy.  Weight-bear as tolerated on right lower extremity.  No hip precautions. Aspirin 325 mg twice daily for DVT prophylaxis x1 month postop. Minimize pain medication. Back to skilled nursing facility in 1 to 2 days. Greatly appreciate hospitalist's help.     Matthew Folks 12/10/2017, 7:12 AM

## 2017-12-11 ENCOUNTER — Other Ambulatory Visit: Payer: Self-pay

## 2017-12-11 DIAGNOSIS — S72001K Fracture of unspecified part of neck of right femur, subsequent encounter for closed fracture with nonunion: Secondary | ICD-10-CM

## 2017-12-11 LAB — BASIC METABOLIC PANEL
Anion gap: 10 (ref 5–15)
BUN: 13 mg/dL (ref 8–23)
CALCIUM: 8.4 mg/dL — AB (ref 8.9–10.3)
CHLORIDE: 107 mmol/L (ref 98–111)
CO2: 22 mmol/L (ref 22–32)
CREATININE: 0.81 mg/dL (ref 0.44–1.00)
GFR calc non Af Amer: >60 mL/min (ref >60)
GLUCOSE: 160 mg/dL — AB (ref 70–99)
Potassium: 3.7 mmol/L (ref 3.5–5.1)
Sodium: 139 mmol/L (ref 135–145)

## 2017-12-11 LAB — CBC
HCT: 23.2 % — ABNORMAL LOW (ref 36.0–46.0)
HEMOGLOBIN: 8.7 g/dL — AB (ref 12.0–15.0)
MCH: 34.5 pg — AB (ref 26.0–34.0)
MCHC: 37.5 g/dL — AB (ref 30.0–36.0)
MCV: 92.1 fL (ref 80.0–100.0)
Platelets: 262 10*3/uL (ref 150–400)
RBC: 2.52 MIL/uL — ABNORMAL LOW (ref 3.87–5.11)
RDW: 14.4 % (ref 11.5–15.5)
WBC: 11.4 10*3/uL — ABNORMAL HIGH (ref 4.0–10.5)
nRBC: 0 % (ref 0.0–0.2)

## 2017-12-11 LAB — MAGNESIUM: Magnesium: 2.1 mg/dL (ref 1.7–2.4)

## 2017-12-11 MED ORDER — INFLUENZA VAC SPLIT HIGH-DOSE 0.5 ML IM SUSY
0.5000 mL | PREFILLED_SYRINGE | INTRAMUSCULAR | Status: AC
  Administered 2017-12-12: 0.5 mL via INTRAMUSCULAR
  Filled 2017-12-11: qty 0.5

## 2017-12-11 NOTE — Progress Notes (Signed)
PROGRESS NOTE    Terri Vasquez  ZOX:[id number] DOB: 03/07/1940 DOA: 12/09/2017 PCP: Renford Dills, MD   Brief Narrative:  77 year old with past medical history relevant for hypothyroidism, hyperlipidemia, dementia admitted for failure of right hip pinning status post total hip arthroplasty and removal of pin on 12/09/2017.  Internal medicine was consulted for electrolyte abnormalities and hypertension.   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  This is been aggressively repleted without any issues.  She has not been symptomatic from it.  #) Hypertension: Currently her blood pressures are supremely well controlled. - Continue amlodipine 5 mg daily, this can be continued at home  #) Lower extremity edema: -Continue home furosemide 40 mg daily  #) Hypothyroid is him: -Continue levothyroxine 125 mg -TSH normal  #) Hyperlipidemia: -Continue atorvastatin 10 mg daily  #) Status post right total hip arthroplasty: Patient is doing dramatically well with minimal pain. - Continue aspirin 325 twice daily -Pending skilled nursing facility   Thank you for this interesting consult we will continue to follow.   Subjective: Patient does not have any complaints today.  She reports she is healing well.  She denies any nausea, vomiting, diarrhea, cough, congestion, rhinorrhea.  Objective: Vitals:   12/10/17 0533 12/10/17 2130 12/11/17 0448 12/11/17 1335  BP: (!) 162/83 133/62 (!) 141/65 (!) 129/58  Pulse: 92 81 80 77  Resp: 14 18 18 18   Temp: (!) 97.3 F (36.3 C) 98.5 F (36.9 C) 98.4 F (36.9 C) 98.1 F (36.7 C)  TempSrc: Oral Oral Oral Oral  SpO2: 97% 96% 97% 98%  Weight:      Height:        Intake/Output Summary (Last 24 hours) at 12/11/2017  1406 Last data filed at 12/11/2017 1300 Gross per 24 hour  Intake 720 ml  Output 200 ml  Net 520 ml   Filed Weights   12/09/17 1104 12/09/17 2130  Weight: 82.1 kg 82.1 kg    Examination:  General exam: Appears calm and comfortable  Respiratory system: Clear to auscultation. Respiratory effort normal. Cardiovascular system: Regular rate and rhythm, no murmurs Gastrointestinal system: Abdomen is nondistended, soft and nontender. No organomegaly or masses felt. Normal bowel sounds heard. Central nervous system: Grossly intact, moving all extremities Extremities: Intact distal pulses, right lower extremity movement limited by pain, + lower extremity edema Skin: Incision site is clean dry and intact Psychiatry: Unable to assess due to medical condition    Data Reviewed: I have personally reviewed following labs and imaging studies  CBC: Recent Labs  Lab 12/10/17 0019 12/11/17 0409  WBC 12.2* 11.4*  HGB 10.2* 8.7*  HCT 30.1* 23.2*  MCV 90.1 92.1  PLT 300 262   Basic Metabolic Panel: Recent Labs  Lab 12/10/17 0019 12/11/17 0409  NA 137 139  K 3.0* 3.7  CL 105 107  CO2 23 22  GLUCOSE 165* 160*  BUN 12 13  CREATININE 0.93 0.81  CALCIUM 8.6* 8.4*  MG 1.6* 2.1   GFR: Estimated Creatinine Clearance: 66.4 mL/min (by C-G formula based on SCr of 0.81 mg/dL). Liver Function Tests: No results for input(s): AST, ALT, ALKPHOS, BILITOT, PROT, ALBUMIN in the last 168 hours. No results for input(s): LIPASE, AMYLASE in the last 168 hours. No results for input(s): AMMONIA in  the last 168 hours. Coagulation Profile: No results for input(s): INR, PROTIME in the last 168 hours. Cardiac Enzymes: No results for input(s): CKTOTAL, CKMB, CKMBINDEX, TROPONINI in the last 168 hours. BNP (last 3 results) No results for input(s): PROBNP in the last 8760 hours. HbA1C: No results for input(s): HGBA1C in the last 72 hours. CBG: No results for input(s): GLUCAP in the last 168  hours. Lipid Profile: Recent Labs    12/10/17 0019  CHOL 118  HDL 37*  LDLCALC 70  TRIG 57  CHOLHDL 3.2   Thyroid Function Tests: Recent Labs    12/10/17 0019  TSH 3.404   Anemia Panel: No results for input(s): VITAMINB12, FOLATE, FERRITIN, TIBC, IRON, RETICCTPCT in the last 72 hours. Sepsis Labs: No results for input(s): PROCALCITON, LATICACIDVEN in the last 168 hours.  Recent Results (from the past 240 hour(s))  Surgical pcr screen     Status: None   Collection Time: 12/03/17 10:38 AM  Result Value Ref Range Status   MRSA, PCR NEGATIVE NEGATIVE Final   Staphylococcus aureus NEGATIVE NEGATIVE Final    Comment: (NOTE) The Xpert SA Assay (FDA approved for NASAL specimens in patients 32 years of age and older), is one component of a comprehensive surveillance program. It is not intended to diagnose infection nor to guide or monitor treatment. Performed at [facility], [address], [place], [place] [address]          Radiology Studies: Dg C-arm 1-60 Min  Result Date: 12/09/2017 CLINICAL DATA:  Operative imaging. Removal of screws and addition of rt anterior hip hardware, images sent to pacs, 70.8 seconds fluoro EXAM: OPERATIVE right HIP (WITH PELVIS IF PERFORMED) 4 VIEWS TECHNIQUE: Fluoroscopic spot image(s) were submitted for interpretation post-operatively. COMPARISON:  None. FINDINGS: Initial image shows 3 screws crossing the intertrochanteric proximal femur into the femoral head. Follow-up imaging shows placement of a total right hip arthroplasty after removal of the screws and resection of the femoral neck and head. The orthopedic hardware appears well seated and aligned. IMPRESSION: Well-positioned total right hip arthroplasty. Electronically Signed   By: Amie Portland M.D.   On: 12/09/2017 17:09   Dg C-arm 1-60 Min  Result Date: 12/09/2017 CLINICAL DATA:  Operative imaging. Removal of screws and addition of rt anterior hip hardware, images sent to pacs,  70.8 seconds fluoro EXAM: OPERATIVE right HIP (WITH PELVIS IF PERFORMED) 4 VIEWS TECHNIQUE: Fluoroscopic spot image(s) were submitted for interpretation post-operatively. COMPARISON:  None. FINDINGS: Initial image shows 3 screws crossing the intertrochanteric proximal femur into the femoral head. Follow-up imaging shows placement of a total right hip arthroplasty after removal of the screws and resection of the femoral neck and head. The orthopedic hardware appears well seated and aligned. IMPRESSION: Well-positioned total right hip arthroplasty. Electronically Signed   By: Amie Portland M.D.   On: 12/09/2017 17:09   Dg Hip Unilat With Pelvis 1v Right  Result Date: 12/09/2017 CLINICAL DATA:  77 year old female post right hip replacement. Subsequent encounter. EXAM: DG HIP (WITH OR WITHOUT PELVIS) 1V RIGHT COMPARISON:  12/09/2017 intraoperative exams. FINDINGS: Post right total hip replacement which appears in satisfactory position without complication noted on frontal view. Bony overgrowth inferior to right greater trochanter felt to be related to pin track (pins have been removed). IMPRESSION: Post total right hip replacement. Electronically Signed   By: Lacy Duverney M.D.   On: 12/09/2017 18:56   Dg Hip Operative Unilat W Or W/o Pelvis Right  Result Date: 12/09/2017 CLINICAL  DATA:  Operative imaging. Removal of screws and addition of rt anterior hip hardware, images sent to pacs, 70.8 seconds fluoro EXAM: OPERATIVE right HIP (WITH PELVIS IF PERFORMED) 4 VIEWS TECHNIQUE: Fluoroscopic spot image(s) were submitted for interpretation post-operatively. COMPARISON:  None. FINDINGS: Initial image shows 3 screws crossing the intertrochanteric proximal femur into the femoral head. Follow-up imaging shows placement of a total right hip arthroplasty after removal of the screws and resection of the femoral neck and head. The orthopedic hardware appears well seated and aligned. IMPRESSION: Well-positioned total right  hip arthroplasty. Electronically Signed   By: Amie Portland M.D.   On: 12/09/2017 17:09        Scheduled Meds: . amLODipine  5 mg Oral Daily  . aspirin EC  325 mg Oral BID PC  . atorvastatin  10 mg Oral Daily  . celecoxib  200 mg Oral Daily  . docusate sodium  100 mg Oral BID  . ferrous sulfate  325 mg Oral Q lunch  . furosemide  40 mg Oral Daily  . levothyroxine  125 mcg Oral QAC breakfast  . Melatonin  1 tablet Oral QHS  . multivitamin  1 tablet Oral Daily   Continuous Infusions: . methocarbamol (ROBAXIN) IV       LOS: 2 days    Time spent: 35    Delaine Lame, MD [facility]   If 7PM-7AM, please contact night-coverage [email or web address] Password [email or web address] 12/11/2017, 2:06 PM

## 2017-12-11 NOTE — Progress Notes (Signed)
Pt is confused but calm and cooperative. Pt  Becomes fidgety and restless when needed to go to the bathroom. Does not know when to call for assistance. Gait is unsteady. Safety sitter at the bedside.

## 2017-12-11 NOTE — Clinical Social Work Note (Signed)
Clinical Social Work Assessment  Patient Details  Name: Terri Vasquez MRN: [id number] Date of Birth: [birth year]  Date of referral:  12/11/17               Reason for consult:  Discharge Planning                Permission sought to share information with:  Case Manager, Facility Medical sales representative, Family Supports Permission granted to share information::  Yes, Verbal Permission Granted  Name::     Production assistant, radio::  SNFs  Relationship::  son  Contact Information:  [phone]  Housing/Transportation Living arrangements for the past 2 months:  Assisted Living Facility Source of Information:  Patient Patient Interpreter Needed:  None Criminal Activity/Legal Involvement Pertinent to Current Situation/Hospitalization:  No - Comment as needed Significant Relationships:  Adult Children Lives with:  Facility Resident Do you feel safe going back to the place where you live?  No Need for family participation in patient care:  Yes (Comment)  Care giving concerns:  CSW received referral for possible SNF placement at time of discharge. Spoke with patient's [name] regarding possibility of SNF placement . Patient is not currently able to return to [facility] where she has been staying due to lack of nursing and Physical Therapy support. Patient and  Son expressed understanding of PT recommendation and are agreeable to SNF placement at time of discharge. CSW to continue to follow and assist with discharge planning needs.     Social Worker assessment / plan: Spoke with patient and  Son concerning possibility of rehab at [facility] before returning home.    Employment status:  Retired Database administrator PT Recommendations:  Skilled Nursing Facility Information / Referral to community resources:  Skilled Nursing Facility  Patient/Family's Response to care:  Patient and son  recognize need for rehab before returning home and are agreeable to a SNF in [place]. They  report preference for [facility] or [facility]   . CSW explained insurance authorization process. Patient's family reported that they want patient to get stronger to be able to come back home to [facility].   Patient/Family's Understanding of and Emotional Response to Diagnosis, Current Treatment, and Prognosis:  Patient/family is realistic regarding therapy needs and expressed being hopeful for SNF placement. Patient expressed understanding of CSW role and discharge process as well as medical condition. No questions/concerns about plan or treatment.    Emotional Assessment Appearance:  Appears stated age Attitude/Demeanor/Rapport:  Unable to Assess Affect (typically observed):  Unable to Assess Orientation:  Oriented to Self Alcohol / Substance use:  Not Applicable Psych involvement (Current and /or in the community):  No (Comment)  Discharge Needs  Concerns to be addressed:  Discharge Planning Concerns Readmission within the last 30 days:  No Current discharge risk:  Dependent with Mobility Barriers to Discharge:  Continued Medical Work up   [employer], LCSW 12/11/2017, 9:50 AM

## 2017-12-11 NOTE — Progress Notes (Signed)
Subjective: 2 Days Post-Op Procedure(s) (LRB): RIGHT TOTAL HIP ARTHROPLASTY ANTERIOR APPROACH (Right) HARDWARE REMOVAL (Right) Patient reports pain as mild.  Had significant confusion with her dementia yesterday and sitter was ordered so that patient did not get out of bed on her own.  Taking by mouth and voiding okay.  Sitter reports that she was able to get up and ambulate in the room with assistance.  I talked to her son Feliz Beam who reported that yesterday she seemed to be doing much better than she was preop related to her hip.  Objective: Vital signs in last 24 hours: Temp:  [98.4 F (36.9 C)-98.5 F (36.9 C)] 98.4 F (36.9 C) (10/09 0448) Pulse Rate:  [80-81] 80 (10/09 0448) Resp:  [18] 18 (10/09 0448) BP: (133-141)/(62-65) 141/65 (10/09 0448) SpO2:  [96 %-97 %] 97 % (10/09 0448)  Intake/Output from previous day: 10/08 0701 - 10/09 0700 In: -  Out: 450 [Urine:450] Intake/Output this shift: Total I/O In: 480 [P.O.:480] Out: -   Recent Labs    12/10/17 0019 12/11/17 0409  HGB 10.2* 8.7*   Recent Labs    12/10/17 0019 12/11/17 0409  WBC 12.2* 11.4*  RBC 3.34* 2.52*  HCT 30.1* 23.2*  PLT 300 262   Recent Labs    12/10/17 0019 12/11/17 0409  NA 137 139  K 3.0* 3.7  CL 105 107  CO2 23 22  BUN 12 13  CREATININE 0.93 0.81  GLUCOSE 165* 160*  CALCIUM 8.6* 8.4*  Postop x-rays of pelvis and right hip show good position of the prosthesis without periprosthetic complications. No results for input(s): LABPT, INR in the last 72 hours. Right hip exam: Minimal pain with range of motion. Neurovascular intact Sensation intact distally Intact pulses distally Dorsiflexion/Plantar flexion intact Incision: dressing C/D/I Compartment soft    Assessment/Plan: 2 Days Post-Op Procedure(s) (LRB): RIGHT TOTAL HIP ARTHROPLASTY ANTERIOR APPROACH (Right) HARDWARE REMOVAL (Right) Acute blood loss anemia, expected. Plan: Up with physical therapy.  Will still need sitter  because of her dementia.  Would minimize pain medication and only use Tylenol for pain. After speaking with the patient's son it looks like she will need a skilled nursing facility.  It will be their choice as to whether they want to provide a sitter. Aspirin 325 mg twice daily for DVT prophylaxis x1 month postop. Weight-bear as tolerated on right hip without hip precautions. She is ready to be discharged as soon as arrangements can be made. Continue oral iron for her anemia. Appreciate internal medicine's help.     Matthew Folks 12/11/2017, 8:28 AM

## 2017-12-11 NOTE — Progress Notes (Signed)
Physical Therapy Treatment Patient Details Name: Terri Vasquez MRN: [id number] DOB: 1940-06-01 Today's Date: 12/11/2017    History of Present Illness 77 y.o. female admitted on 12/09/17 after fall with R hip fx s/p R hip hemiarthroplasty direct anterior, WBAT post op.  Pt with significant h/o dementia, HTN, anxiety, R hip pinning (08/2017).     PT Comments    Continuing work on functional mobility and activity tolerance;  otable imporvements in gait and RW control; Mod assist today to power up to stand and control descent form stand to sit; Noted that family has concerns about ALF being able to meet Ms. Main's needs -- if this is the case, she will need SNF for post-acute rehab   Follow Up Recommendations  SNF;Other (comment)(Family isn't confident ALF can meet her needs)     Equipment Recommendations  Rolling walker with 5" wheels;3in1 (PT);Other (comment)(unsure of what she already has)    Recommendations for Other Services       Precautions / Restrictions Precautions Precautions: Fall Restrictions RLE Weight Bearing: Weight bearing as tolerated    Mobility  Bed Mobility                  Transfers Overall transfer level: Needs assistance Equipment used: Rolling walker (2 wheeled) Transfers: Sit to/from Stand Sit to Stand: Mod assist         General transfer comment: Mod assist and coordiantion of assist (by counting to 3) to power up; stood from recliner and commode; mod assist to control descent form stand to sit  Ambulation/Gait Ambulation/Gait assistance: Min assist;Min guard Gait Distance (Feet): 70 Feet Assistive device: Rolling walker (2 wheeled) Gait Pattern/deviations: Step-through pattern;Antalgic     General Gait Details: Pt with moderately antalgic gait pattern, min assist for safety and balance, as well as manual assistance to help steer RW.    Stairs             Wheelchair Mobility    Modified Rankin (Stroke Patients Only)        Balance     Sitting balance-Leahy Scale: Good       Standing balance-Leahy Scale: Poor                              Cognition Arousal/Alertness: Awake/alert Behavior During Therapy: WFL for tasks assessed/performed Overall Cognitive Status: History of cognitive impairments - at baseline                                 General Comments: Was able to identify that she has difficulty walking      Exercises Total Joint Exercises Ankle Circles/Pumps: AAROM;Both;20 reps Gluteal Sets: AROM;Both;10 reps Towel Squeeze: AROM;Both;10 reps Heel Slides: AAROM;Right;10 reps Hip ABduction/ADduction: AAROM;Right;10 reps Long Arc Quad: AROM;Both;10 reps(alternating)    General Comments        Pertinent Vitals/Pain Pain Assessment: Faces Faces Pain Scale: Hurts a little bit Pain Location: right leg Pain Descriptors / Indicators: Grimacing;Guarding(Describes R thigh as "stuffy") Pain Intervention(s): Monitored during session;Repositioned    Home Living                      Prior Function            PT Goals (current goals can now be found in the care plan section) Acute Rehab PT Goals Patient Stated Goal: Unable to state, agreeable  to walking PT Goal Formulation: With patient Time For Goal Achievement: 12/17/17 Potential to Achieve Goals: Good Progress towards PT goals: Progressing toward goals    Frequency    Min 5X/week      PT Plan Discharge plan needs to be updated    Co-evaluation              AM-PAC PT "6 Clicks" Daily Activity  Outcome Measure  Difficulty turning over in bed (including adjusting bedclothes, sheets and blankets)?: Unable Difficulty moving from lying on back to sitting on the side of the bed? : Unable Difficulty sitting down on and standing up from a chair with arms (e.g., wheelchair, bedside commode, etc,.)?: Unable Help needed moving to and from a bed to chair (including a wheelchair)?: A Little Help  needed walking in hospital room?: A Little Help needed climbing 3-5 steps with a railing? : A Little 6 Click Score: 12    End of Session Equipment Utilized During Treatment: Gait belt Activity Tolerance: Patient tolerated treatment well Patient left: in chair;with call bell/phone within reach;with nursing/sitter in room Nurse Communication: Mobility status PT Visit Diagnosis: Muscle weakness (generalized) (M62.81);Difficulty in walking, not elsewhere classified (R26.2);Pain Pain - Right/Left: Right Pain - part of body: Leg     Time: 1027-2536 PT Time Calculation (min) (ACUTE ONLY): 24 min  Charges:  $Gait Training: 8-22 mins $Therapeutic Exercise: 8-22 mins                     Van Clines, PT  Acute Rehabilitation Services Pager [phone] Office [phone]    Levi Aland 12/11/2017, 9:25 AM

## 2017-12-11 NOTE — NC FL2 (Signed)
[place] MEDICAID FL2 LEVEL OF CARE SCREENING TOOL     IDENTIFICATION  Patient Name: Terri Vasquez Birthdate: [birth year] Sex: female Admission Date (Current Location): 12/09/2017  [facility] and [place] Number:  Producer, television/film/video and Address:  [facility], [address] [place], [place] [address]      Provider Number: [id number]  Attending Physician Name and Address:  Jodi Geralds, MD  Relative Name and Phone Number:  Neal Dy [phone]    Current Level of Care: Hospital Recommended Level of Care: Skilled Nursing Facility Prior Approval Number:    Date Approved/Denied:   PASRR Number: [id number]  Discharge Plan: SNF    Current Diagnoses: Patient Active Problem List   Diagnosis Date Noted  . 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 Retained orthopedic hardware 12/09/2017  . Osteoarthritis of right hip 12/09/2017  . Primary osteoarthritis of right hip 12/09/2017  . Essential hypertension 08/24/2017  . Hypothyroid 08/24/2017  . AKI (acute kidney injury) (HCC) 08/24/2017  . Hip fracture (HCC) 08/23/2017  . Fracture of femoral neck, right (HCC) 08/23/2017  . Memory loss 10/01/2016  . 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    Orientation RESPIRATION BLADDER Height & Weight     Self  Normal Incontinent Weight: 181 lb (82.1 kg) Height:  5\' 8"  (172.7 cm)  BEHAVIORAL SYMPTOMS/MOOD NEUROLOGICAL BOWEL NUTRITION STATUS      Continent Diet  AMBULATORY STATUS COMMUNICATION OF NEEDS Skin   Extensive Assist Verbally Surgical wounds(Closed incision on right hip and thigh)                       Personal Care Assistance Level of Assistance  Bathing, Feeding, Dressing Bathing Assistance: Limited assistance Feeding assistance: Limited assistance Dressing Assistance: Limited assistance     Functional Limitations Info  Sight, Hearing, Speech Sight Info: Adequate Hearing Info:  Adequate Speech Info: Adequate    SPECIAL CARE FACTORS FREQUENCY  PT (By licensed PT), OT (By licensed OT)     PT Frequency: 5x weekly OT Frequency: 5x weekly            Contractures Contractures Info: Not present    Additional Factors Info  Code Status, Allergies Code Status Info: DNR Allergies Info: No Known           Current Medications (12/11/2017):  This is the current hospital active medication list Current Facility-Administered Medications  Medication Dose Route Frequency Provider Last Rate Last Dose  . acetaminophen (TYLENOL) tablet 325-650 mg  325-650 mg Oral Q6H PRN Marshia Ly, PA-C      . alum & mag hydroxide-simeth (MAALOX/MYLANTA) 200-200-20 MG/5ML suspension 30 mL  30 mL Oral Q4H PRN Marshia Ly, PA-C      . amLODipine (NORVASC) tablet 5 mg  5 mg Oral Daily John Giovanni, MD   5 mg at 12/11/17 0943  . aspirin EC tablet 325 mg  325 mg Oral BID PC Marshia Ly, PA-C   325 mg at 12/11/17 0943  . atorvastatin (LIPITOR) tablet 10 mg  10 mg Oral Daily Marshia Ly, PA-C   10 mg at 12/11/17 0943  . bisacodyl (DULCOLAX) EC tablet 5 mg  5 mg Oral Daily PRN Marshia Ly, PA-C      . celecoxib (CELEBREX) capsule 200 mg  200 mg Oral Daily Marshia Ly, PA-C   200 mg at 12/11/17 0943  . diphenhydrAMINE (BENADRYL) 12.5 MG/5ML elixir 12.5-25 mg  12.5-25 mg Oral  Q4H PRN Marshia Ly, PA-C      . docusate sodium (COLACE) capsule 100 mg  100 mg Oral BID Marshia Ly, PA-C   100 mg at 12/11/17 0943  . ferrous sulfate tablet 325 mg  325 mg Oral Q lunch Marshia Ly, PA-C   325 mg at 12/10/17 1316  . furosemide (LASIX) tablet 40 mg  40 mg Oral Daily Marshia Ly, PA-C   40 mg at 12/11/17 0943  . hydrALAZINE (APRESOLINE) injection 5 mg  5 mg Intravenous Q8H PRN John Giovanni, MD      . HYDROcodone-acetaminophen (NORCO/VICODIN) 5-325 MG per tablet 1 tablet  1 tablet Oral Q6H PRN Marshia Ly, PA-C   1 tablet at 12/09/17 2241  . levothyroxine  (SYNTHROID, LEVOTHROID) tablet 125 mcg  125 mcg Oral QAC breakfast Marshia Ly, PA-C   125 mcg at 12/11/17 1610  . magnesium citrate solution 1 Bottle  1 Bottle Oral Once PRN Marshia Ly, PA-C      . Melatonin TABS 3 mg  1 tablet Oral QHS Marshia Ly, PA-C   3 mg at 12/10/17 2130  . methocarbamol (ROBAXIN) tablet 500 mg  500 mg Oral Q6H PRN Marshia Ly, PA-C   500 mg at 12/09/17 2239   Or  . methocarbamol (ROBAXIN) 500 mg in dextrose 5 % 50 mL IVPB  500 mg Intravenous Q6H PRN Marshia Ly, PA-C      . morphine 2 MG/ML injection 0.5 mg  0.5 mg Intravenous Q2H PRN Marshia Ly, PA-C      . multivitamin (PROSIGHT) tablet 1 tablet  1 tablet Oral Daily Jodi Geralds, MD   1 tablet at 12/11/17 0943  . ondansetron (ZOFRAN) tablet 4 mg  4 mg Oral Q6H PRN Marshia Ly, PA-C       Or  . ondansetron [facility]) injection 4 mg  4 mg Intravenous Q6H PRN Marshia Ly, PA-C      . polyethylene glycol (MIRALAX / GLYCOLAX) packet 17 g  17 g Oral Daily PRN Marshia Ly, PA-C         Discharge Medications: Please see discharge summary for a list of discharge medications.  Relevant Imaging Results:  Relevant Lab Results:   Additional Information SS# [id number]  Gildardo Griffes, LCSW

## 2017-12-12 ENCOUNTER — Encounter (HOSPITAL_COMMUNITY): Payer: Self-pay | Admitting: General Practice

## 2017-12-12 DIAGNOSIS — F0391 Unspecified dementia with behavioral disturbance: Secondary | ICD-10-CM | POA: Diagnosis present

## 2017-12-12 DIAGNOSIS — D62 Acute posthemorrhagic anemia: Secondary | ICD-10-CM

## 2017-12-12 DIAGNOSIS — F03918 Unspecified dementia, unspecified severity, with other behavioral disturbance: Secondary | ICD-10-CM | POA: Diagnosis present

## 2017-12-12 LAB — CBC
HCT: 23.9 % — ABNORMAL LOW (ref 36.0–46.0)
Hemoglobin: 7.9 g/dL — ABNORMAL LOW (ref 12.0–15.0)
MCH: 30.6 pg (ref 26.0–34.0)
MCHC: 33.1 g/dL (ref 30.0–36.0)
MCV: 92.6 fL (ref 80.0–100.0)
Platelets: 256 K/uL (ref 150–400)
RBC: 2.58 MIL/uL — ABNORMAL LOW (ref 3.87–5.11)
RDW: 14.1 % (ref 11.5–15.5)
WBC: 9.7 10*3/uL (ref 4.0–10.5)
nRBC: 0 % (ref 0.0–0.2)

## 2017-12-12 LAB — BASIC METABOLIC PANEL
BUN: 16 mg/dL (ref 8–23)
Calcium: 8 mg/dL — ABNORMAL LOW (ref 8.9–10.3)
Creatinine, Ser: 0.78 mg/dL (ref 0.44–1.00)
GFR calc Af Amer: >60 mL/min (ref >60)
GFR calc non Af Amer: >60 mL/min (ref >60)
Glucose, Bld: 102 mg/dL — ABNORMAL HIGH (ref 70–99)

## 2017-12-12 LAB — BASIC METABOLIC PANEL WITH GFR
Anion gap: 8 (ref 5–15)
CO2: 25 mmol/L (ref 22–32)
Chloride: 108 mmol/L (ref 98–111)
Potassium: 3 mmol/L — ABNORMAL LOW (ref 3.5–5.1)
Sodium: 141 mmol/L (ref 135–145)

## 2017-12-12 MED ORDER — ASPIRIN 325 MG PO TBEC: 325.0000 mg | DELAYED_RELEASE_TABLET | Freq: Two times a day (BID) | ORAL | 0 refills | Status: DC

## 2017-12-12 MED ORDER — POTASSIUM CHLORIDE CRYS ER 20 MEQ PO TBCR: 20.0000 meq | EXTENDED_RELEASE_TABLET | Freq: Every day | ORAL | Status: DC

## 2017-12-12 MED ORDER — POTASSIUM CHLORIDE CRYS ER 20 MEQ PO TBCR
40.0000 meq | EXTENDED_RELEASE_TABLET | ORAL | Status: AC
  Administered 2017-12-12 (×2): 40 meq via ORAL
  Filled 2017-12-12 (×2): qty 2

## 2017-12-12 MED ORDER — POTASSIUM CHLORIDE CRYS ER 20 MEQ PO TBCR: 20.0000 meq | EXTENDED_RELEASE_TABLET | Freq: Every day | ORAL | 1 refills | Status: AC

## 2017-12-12 NOTE — Progress Notes (Signed)
Discharge instructions completed with pt.  Pt alert and oriented x1.  Pt needs reinforcement teaching in regards to her teaching.  Pt denies chest pain, shortness of breath, dizziness, lightheadedness, and n/v.  Discharged to facility.

## 2017-12-12 NOTE — Clinical Social Work Placement (Signed)
   CLINICAL SOCIAL WORK PLACEMENT  NOTE  Date:  12/12/2017  Patient Details  Name: Terri Vasquez MRN: [id number] Date of Birth: 02/18/1941  Clinical Social Work is seeking post-discharge placement for this patient at the Skilled  Nursing Facility level of care (*CSW will initial, date and re-position this form in  chart as items are completed):  Yes   Patient/family provided with [place] Clinical Social Work Department's list of facilities offering this level of care within the geographic area requested by the patient (or if unable, by the patient's family).  Yes   Patient/family informed of their freedom to choose among providers that offer the needed level of care, that participate in Medicare, Medicaid or managed care program needed by the patient, have an available bed and are willing to accept the patient.      Patient/family informed of [facility] ownership interest in [facility] and [facility], as well as of the fact that they are under no obligation to receive care at these facilities.  PASRR submitted to EDS on       PASRR number received on 12/11/17     Existing PASRR number confirmed on       FL2 transmitted to all facilities in geographic area requested by pt/family on 12/11/17     FL2 transmitted to all facilities within larger geographic area on       Patient informed that his/her managed care company has contracts with or will negotiate with certain facilities, including the following:        Yes   Patient/family informed of bed offers received.  Patient chooses bed at [facility]     Physician recommends and patient chooses bed at      Patient to be transferred to [facility] on 12/12/17.  Patient to be transferred to facility by [facility]     Patient family notified on 12/12/17 of transfer.  Name of family member notified:  Feliz Beam (son)     PHYSICIAN Please sign FL2, Please sign DNR, Please prepare prescriptions      Additional Comment:    _______________________________________________ Gildardo Griffes, LCSW 12/12/2017, 1:24 PM

## 2017-12-12 NOTE — Progress Notes (Signed)
Physical Therapy Treatment Patient Details Name: Terri Vasquez MRN: [id number] DOB: [birth year] Today's Date: 12/12/2017    History of Present Illness 77 y.o. female admitted on 12/09/17 after fall with R hip fx s/p R hip hemiarthroplasty direct anterior, WBAT post op.  Pt with significant h/o dementia, HTN, anxiety, R hip pinning (08/2017).     PT Comments    Patient - received in bed. Agreeable to participation with PT. Short distance ambulation to restroom as EMS arrived. Requires Mod A for sit to stand at bedside and toilet with up to Min A for gait for safety and steadying.    Follow Up Recommendations  SNF;Other (comment)     Equipment Recommendations  None recommended by PT    Recommendations for Other Services       Precautions / Restrictions Precautions Precautions: Fall Restrictions Weight Bearing Restrictions: Yes RLE Weight Bearing: Weight bearing as tolerated    Mobility  Bed Mobility Overal bed mobility: Needs Assistance Bed Mobility: Supine to Sit     Supine to sit: Min assist     General bed mobility comments: for LE management   Transfers Overall transfer level: Needs assistance Equipment used: Rolling walker (2 wheeled) Transfers: Sit to/from Stand Sit to Stand: Mod assist         General transfer comment: Mod A to power up from bedside  Ambulation/Gait Ambulation/Gait assistance: Min guard;Min assist Gait Distance (Feet): 20 Feet Assistive device: Rolling walker (2 wheeled) Gait Pattern/deviations: Step-through pattern;Antalgic Gait velocity: decreased   General Gait Details: short distance to restroom as EMS was arriving   [place] Rankin (Stroke Patients Only)       Balance Overall balance assessment: Needs assistance Sitting-balance support: Feet supported;No upper extremity supported Sitting balance-Leahy Scale: Good     Standing balance support: Bilateral upper extremity  supported Standing balance-Leahy Scale: Poor Standing balance comment: reliant on external support                            Cognition Arousal/Alertness: Awake/alert Behavior During Therapy: WFL for tasks assessed/performed Overall Cognitive Status: History of cognitive impairments - at baseline                                        Exercises      General Comments        Pertinent Vitals/Pain Pain Assessment: Faces Faces Pain Scale: Hurts a little bit Pain Location: right leg Pain Descriptors / Indicators: Guarding;Grimacing(with motion) Pain Intervention(s): Limited activity within patient's tolerance;Monitored during session    Home Living                      Prior Function            PT Goals (current goals can now be found in the care plan section) Acute Rehab PT Goals Patient Stated Goal: unable to state PT Goal Formulation: With patient Time For Goal Achievement: 12/17/17 Potential to Achieve Goals: Good Progress towards PT goals: Progressing toward goals    Frequency    Min 5X/week      PT Plan Current plan remains appropriate    Co-evaluation              AM-PAC PT "6 Clicks"  Daily Activity  Outcome Measure  Difficulty turning over in bed (including adjusting bedclothes, sheets and blankets)?: Unable Difficulty moving from lying on back to sitting on the side of the bed? : Unable Difficulty sitting down on and standing up from a chair with arms (e.g., wheelchair, bedside commode, etc,.)?: Unable Help needed moving to and from a bed to chair (including a wheelchair)?: A Lot Help needed walking in hospital room?: A Little Help needed climbing 3-5 steps with a railing? : A Little 6 Click Score: 11    End of Session Equipment Utilized During Treatment: Gait belt Activity Tolerance: Patient tolerated treatment well Patient left: (on stretcher with EMS) Nurse Communication: Mobility status PT Visit  Diagnosis: Muscle weakness (generalized) (M62.81);Difficulty in walking, not elsewhere classified (R26.2);Pain Pain - Right/Left: Right Pain - part of body: Leg     Time: 1610-9604 PT Time Calculation (min) (ACUTE ONLY): 28 min  Charges:  $Gait Training: 8-22 mins $Therapeutic Activity: 8-22 mins                     Kipp Laurence, PT, DPT Supplemental Physical Therapist 12/12/17 4:45 PM Pager: [phone] Office: [phone]

## 2017-12-12 NOTE — Care Management Important Message (Signed)
Important Message  Patient Details  Name: Terri Vasquez MRN: [id number] Date of Birth: [birth year]   Medicare Important Message Given:  Yes    Dorena Bodo 12/12/2017, 4:38 PM

## 2017-12-12 NOTE — Progress Notes (Signed)
Patient will DC to: [facility] Anticipated DC date: 12/12/17 Family notified: Feliz Beam (son) Transport by: Sharin Mons  Per MD patient ready for DC to [facility]. RN, patient, patient's family, and facility notified of DC. Discharge Summary sent to facility. RN given number for report [phone] Room 3247 . DC packet on chart. Ambulance transport requested for patient.  CSW signing off.  [place], [place] [phone]

## 2017-12-12 NOTE — Progress Notes (Signed)
Subjective: 3 Days Post-Op Procedure(s) (LRB): RIGHT TOTAL HIP ARTHROPLASTY ANTERIOR APPROACH (Right) HARDWARE REMOVAL (Right) Patient reports pain as mild.    Objective: Vital signs in last 24 hours: Temp:  [98.1 F (36.7 C)-98.7 F (37.1 C)] 98.7 F (37.1 C) (10/10 0513) Pulse Rate:  [63-77] 63 (10/10 0513) Resp:  [17-18] 17 (10/10 0513) BP: (127-142)/(58-69) 142/69 (10/10 0513) SpO2:  [97 %-98 %] 97 % (10/10 0513)  Intake/Output from previous day: 10/09 0701 - 10/10 0700 In: 840 [P.O.:840] Out: -  Intake/Output this shift: Total I/O In: 175 [P.O.:175] Out: -   Recent Labs    12/10/17 0019 12/11/17 0409 12/12/17 0546  HGB 10.2* 8.7* 7.9*   Recent Labs    12/11/17 0409 12/12/17 0546  WBC 11.4* 9.7  RBC 2.52* 2.58*  HCT 23.2* 23.9*  PLT 262 256   Recent Labs    12/11/17 0409 12/12/17 0546  NA 139 141  K 3.7 3.0*  CL 107 108  CO2 22 25  BUN 13 16  CREATININE 0.81 0.78  GLUCOSE 160* 102*  CALCIUM 8.4* 8.0*   No results for input(s): LABPT, INR in the last 72 hours.  Neurologically intact Neurovascular intact Sensation intact distally No cellulitis present Compartment soft    Assessment/Plan: 3 Days Post-Op Procedure(s) (LRB): RIGHT TOTAL HIP ARTHROPLASTY ANTERIOR APPROACH (Right) HARDWARE REMOVAL (Right)  Anemia-- Pt asymptomatic with current hgb and appropriate for d/c SNF with current level.w  Advance diet Up with therapy Discharge to SNF when bed available.    Terri Vasquez 12/12/2017, 8:13 AM

## 2017-12-12 NOTE — Discharge Summary (Signed)
Patient ID: [name] MRN: [id number] DOB/AGE: June 18, 1940 77 y.o.  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 Anxiety   . Essential hypertension 08/24/2017  . Family history of adverse reaction to anesthesia    sons have difficulty waking up  . Hypertension   . Hypothyroid 08/24/2017  . Hypothyroidism   . Thyroid disease   . Varicose veins     Surgeries: Procedure(s): RIGHT TOTAL HIP ARTHROPLASTY ANTERIOR APPROACH HARDWARE REMOVAL on 12/09/2017   Consultants:  [facility] Discharged Condition: Improved  Hospital Course: Terri Vasquez is an 77 y.o. female who was admitted 12/09/2017 for operative treatment ofClosed fracture of right hip with nonunion. Patient has severe unremitting pain that affects sleep, daily activities, and work/hobbies. After pre-op clearance the patient was taken to the operating room on 12/09/2017 and underwent  Procedure(s): RIGHT TOTAL HIP ARTHROPLASTY ANTERIOR APPROACH HARDWARE REMOVAL.    Patient was given perioperative antibiotics:  Anti-infectives (From admission, onward)   Start     Dose/Rate Route Frequency Ordered Stop   12/09/17 2300  ceFAZolin (ANCEF) IVPB 2g/100 mL premix     2 g 200 mL/hr over 30 Minutes Intravenous Every 6 hours 12/09/17 2206 12/10/17 0710   12/09/17 0715  ceFAZolin (ANCEF) IVPB 2g/100 mL premix     2 g 200 mL/hr over 30 Minutes Intravenous On call to O.R. 12/09/17 0700 12/09/17 1433       Patient was given sequential compression devices, early ambulation, and chemoprophylaxis to prevent DVT.She has significant dementia and had a fair amount of Confusion  trying to get out of bed postoperatively.  A sitter was ordered.   Internal medicine managed her medical issues and we appreciate their help.  On the date of discharge she was ambulating with minimal pain in her right hip.  She was a bit unsteady, but her right hip pain was much improved.  Her vital signs were stable and she was afebrile.She was stable medically.  Patient benefited maximally from hospital stay and there were no complications.    Recent vital signs:  Patient Vitals for the past 24 hrs:  BP Temp Temp src Pulse Resp SpO2  12/12/17 0833 129/65 - - - - -  12/12/17 0831 129/65 98.2 F (36.8 C) Oral - 17 97 %  12/12/17 0513 (!) 142/69 98.7 F (37.1 C) Oral 63 17 97 %  12/11/17 1958 127/61 98.4 F (36.9 C) Oral 67 18 97 %  12/11/17 1335 (!) 129/58 98.1 F (36.7 C) Oral 77 18 98 %     Recent laboratory studies:  Recent Labs    12/11/17 0409 12/12/17 0546  WBC 11.4* 9.7  HGB 8.7* 7.9*  HCT 23.2* 23.9*  PLT 262 256  NA 139 141  K 3.7 3.0*  CL 107 108  CO2 22 25  BUN 13 16  CREATININE 0.81 0.78  GLUCOSE 160* 102*  CALCIUM 8.4* 8.0*     Discharge Medications:   Allergies as of 12/12/2017   No Known Allergies     Medication List    STOP taking these medications   HYDROcodone-acetaminophen 5-325 MG tablet Commonly known as:  NORCO/VICODIN  methocarbamol 500 MG tablet Commonly known as:  ROBAXIN     TAKE these medications   acetaminophen 325 MG tablet Commonly known as:  TYLENOL Take 1-2 tablets (325-650 mg total) by mouth every 6 (six) hours as needed for mild pain (pain score 1-3 or temp > 100.5).   aspirin 325 MG EC tablet Take 1 tablet (325 mg total) by mouth 2 (two) times daily. What changed:  Another medication with the same name was added. Make sure you understand how and when to take each.   aspirin 325 MG EC tablet Take 1 tablet (325 mg total) by mouth 2 (two) times daily after a meal. What changed:  You were already taking a medication with  the same name, and this prescription was added. Make sure you understand how and when to take each.   atorvastatin 10 MG tablet Commonly known as:  LIPITOR Take 10 mg by mouth daily.   CALCIUM 500/D 500-400 MG-UNIT Chew Generic drug:  Calcium Carb-Cholecalciferol Chew 1 tablet by mouth daily.   docusate sodium 100 MG capsule Commonly known as:  COLACE Take 1 capsule (100 mg total) by mouth 2 (two) times daily.   ferrous sulfate 325 (65 FE) MG EC tablet Take 325 mg by mouth 2 (two) times daily.   furosemide 40 MG tablet Commonly known as:  LASIX Take 40 mg by mouth daily.   ICAPS AREDS 2 PO Take 2 tablets by mouth daily.   levothyroxine 125 MCG tablet Commonly known as:  SYNTHROID, LEVOTHROID Take 125 mcg by mouth daily before breakfast.   Melatonin 3 MG Tabs Take 1 tablet by mouth at bedtime.   polyethylene glycol packet Commonly known as:  MIRALAX / GLYCOLAX Take 17 g by mouth daily as needed for mild constipation.   potassium chloride 10 MEQ tablet Commonly known as:  K-DUR,KLOR-CON Take 10 mEq by mouth daily.   tiZANidine 2 MG tablet Commonly known as:  ZANAFLEX Take 1 mg by mouth 3 (three) times daily.            Durable Medical Equipment  (From admission, onward)         Start     Ordered   12/10/17 1045  For home use only DME Walker rolling  Once    Question:  Patient needs a walker to treat with the following condition  Answer:  S/P total hip arthroplasty   12/10/17 1045   12/10/17 1045  For home use only DME 3 n 1  Once     12/10/17 1045           Discharge Care Instructions  (From admission, onward)         Start     Ordered   12/12/17 0000  Weight bearing as tolerated    Comments:  No hip precautions  Question Answer Comment  Laterality right   Extremity Lower      12/12/17 1019          Diagnostic Studies: Dg Chest 2 View  Result Date: 12/03/2017 CLINICAL DATA:  Pre-admission study prior to total hip replacement. Former  smoker. History of hypertension. EXAM: CHEST - 2 VIEW COMPARISON:  Portable chest x-ray of August 23, 2017 FINDINGS: The lungs are mildly hyperinflated. The interstitial markings are coarse. There is no alveolar infiltrate or pleural effusion. There is calcification in the wall of the aortic arch. The bony thorax exhibits no acute abnormality. IMPRESSION: Chronic bronchitic-smoking related changes, stable. No pneumonia, CHF, nor other acute cardiopulmonary  abnormality. Thoracic aortic atherosclerosis. Electronically Signed   By: David  [place] M.D.   On: 12/03/2017 11:25   Dg C-arm 1-60 Min  Result Date: 12/09/2017 CLINICAL DATA:  Operative imaging. Removal of screws and addition of rt anterior hip hardware, images sent to pacs, 70.8 seconds fluoro EXAM: OPERATIVE right HIP (WITH PELVIS IF PERFORMED) 4 VIEWS TECHNIQUE: Fluoroscopic spot image(s) were submitted for interpretation post-operatively. COMPARISON:  None. FINDINGS: Initial image shows 3 screws crossing the intertrochanteric proximal femur into the femoral head. Follow-up imaging shows placement of a total right hip arthroplasty after removal of the screws and resection of the femoral neck and head. The orthopedic hardware appears well seated and aligned. IMPRESSION: Well-positioned total right hip arthroplasty. Electronically Signed   By: Amie Portland M.D.   On: 12/09/2017 17:09   Dg C-arm 1-60 Min  Result Date: 12/09/2017 CLINICAL DATA:  Operative imaging. Removal of screws and addition of rt anterior hip hardware, images sent to pacs, 70.8 seconds fluoro EXAM: OPERATIVE right HIP (WITH PELVIS IF PERFORMED) 4 VIEWS TECHNIQUE: Fluoroscopic spot image(s) were submitted for interpretation post-operatively. COMPARISON:  None. FINDINGS: Initial image shows 3 screws crossing the intertrochanteric proximal femur into the femoral head. Follow-up imaging shows placement of a total right hip arthroplasty after removal of the screws and resection of the  femoral neck and head. The orthopedic hardware appears well seated and aligned. IMPRESSION: Well-positioned total right hip arthroplasty. Electronically Signed   By: Amie Portland M.D.   On: 12/09/2017 17:09   Dg Hip Unilat With Pelvis 1v Right  Result Date: 12/09/2017 CLINICAL DATA:  77 year old female post right hip replacement. Subsequent encounter. EXAM: DG HIP (WITH OR WITHOUT PELVIS) 1V RIGHT COMPARISON:  12/09/2017 intraoperative exams. FINDINGS: Post right total hip replacement which appears in satisfactory position without complication noted on frontal view. Bony overgrowth inferior to right greater trochanter felt to be related to pin track (pins have been removed). IMPRESSION: Post total right hip replacement. Electronically Signed   By: Lacy Duverney M.D.   On: 12/09/2017 18:56   Dg Hip Operative Unilat W Or W/o Pelvis Right  Result Date: 12/09/2017 CLINICAL DATA:  Operative imaging. Removal of screws and addition of rt anterior hip hardware, images sent to pacs, 70.8 seconds fluoro EXAM: OPERATIVE right HIP (WITH PELVIS IF PERFORMED) 4 VIEWS TECHNIQUE: Fluoroscopic spot image(s) were submitted for interpretation post-operatively. COMPARISON:  None. FINDINGS: Initial image shows 3 screws crossing the intertrochanteric proximal femur into the femoral head. Follow-up imaging shows placement of a total right hip arthroplasty after removal of the screws and resection of the femoral neck and head. The orthopedic hardware appears well seated and aligned. IMPRESSION: Well-positioned total right hip arthroplasty. Electronically Signed   By: Amie Portland M.D.   On: 12/09/2017 17:09   Dg Hips Bilat With Pelvis 3-4 Views  Result Date: 11/20/2017 CLINICAL DATA:  Unwitnessed fall EXAM: DG HIP (WITH OR WITHOUT PELVIS) 3-4V BILAT COMPARISON:  08/26/2017, 08/23/2017, head CT 10/24/2017 FINDINGS: SI joints are non widened.  Pubic symphysis and rami appear intact. Left hip: No fracture or malalignment. Joint  space is maintained. Shortened appearance of the right femoral neck with protrusion of proximal aspects of the screws beyond femoral cortex, grossly similar as compared with CT from 10/24/2017, changed in appearance since 08/23/2017 fluoroscopic images. Superior most fixating screw approaches the femoral head cortex. IMPRESSION: 1. No acute osseous abnormality of the left hip 2. Prior screw fixation of right femoral neck fracture. Alignment grossly similar as  compared with CT 10/24/2017 at which time it was noted that the screws or protruding from the lateral femoral cortex and there was interval collapse of right femoral head on the femoral neck. Tip of the most cephalad screw approaches or possibly breaches the right femoral head cortex. Electronically Signed   By: Jasmine Pang M.D.   On: 11/20/2017 23:54    Disposition: Discharge disposition: 03-Skilled Nursing Facility       Discharge Instructions    Call MD / Call 911   Complete by:  As directed    If you experience chest pain or shortness of breath, CALL 911 and be transported to the hospital emergency room.  If you develope a fever above 101 F, pus (white drainage) or increased drainage or redness at the wound, or calf pain, call your surgeon's office.   Constipation Prevention   Complete by:  As directed    Drink plenty of fluids.  Prune juice may be helpful.  You may use a stool softener, such as Colace (over the counter) 100 mg twice a day.  Use MiraLax (over the counter) for constipation as needed.   Diet general   Complete by:  As directed    Increase activity slowly as tolerated   Complete by:  As directed    Weight bearing as tolerated   Complete by:  As directed    No hip precautions   Laterality:  right   Extremity:  Lower       Contact information for follow-up providers    Jodi Geralds, MD. Schedule an appointment as soon as possible for a visit in 2 weeks.   Specialty:  Orthopedic Surgery Contact information: [address] [place] [address] [phone]            Contact information for after-discharge care    Destination    [facility] Preferred SNF .   Service:  Skilled Nursing Contact information: [address] [place] [address] [phone]                   Signed: Matthew Folks 12/12/2017, 10:20 AM

## 2017-12-12 NOTE — Progress Notes (Signed)
PROGRESS NOTE    Terri Vasquez  ZOX:[id number] DOB: 05/25/1940 DOA: 12/09/2017 PCP: Renford Dills, MD   Brief Narrative:  77 year old with past medical history relevant for hypothyroidism, hyperlipidemia, dementia admitted for failure of right hip pinning status post total hip arthroplasty and removal of pin on 12/09/2017.  Internal medicine was consulted for electrolyte abnormalities and hypertension.   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 -Supplement -Start standing first 0.20 mEq -BMP tomorrow #) Hypertension: Currently her blood pressures are supremely well controlled. - Continue amlodipine 5 mg daily, this can be continued at home  #) Lower extremity edema: -Continue home furosemide 40 mg daily  #) Hypothyroid is him: -Continue levothyroxine 125 mcg -TSH normal  #) Hyperlipidemia: -Continue atorvastatin 10 mg daily  #) Status post right total hip arthroplasty: Patient is doing dramatically well with minimal pain. - Continue aspirin 325 twice daily -Pending skilled nursing facility   Thank you for this interesting consult we will continue to follow.   Subjective: Patient does not have any complaints today.  She denies any numbness, tingling, nausea, vomiting, diarrhea.  Objective: Vitals:   12/11/17 1958 12/12/17 0513 12/12/17 0831 12/12/17 0833  BP: 127/61 (!) 142/69 129/65 129/65  Pulse: 67 63    Resp: 18 17 17    Temp: 98.4 F (36.9 C) 98.7 F (37.1 C) 98.2 F (36.8 C)   TempSrc: Oral Oral Oral   SpO2: 97% 97% 97%   Weight:      Height:        Intake/Output Summary (Last 24 hours) at 12/12/2017 1241 Last data filed at 12/12/2017 0720 Gross per  24 hour  Intake 535 ml  Output -  Net 535 ml   Filed Weights   12/09/17 1104 12/09/17 2130  Weight: 82.1 kg 82.1 kg    Examination:  General exam: Appears calm and comfortable  Respiratory system: Clear to auscultation. Respiratory effort normal. Cardiovascular system: Regular rate and rhythm, no murmurs Gastrointestinal system: Abdomen is nondistended, soft and nontender. No organomegaly or masses felt. Normal bowel sounds heard. Central nervous system: Grossly intact, moving all extremities Extremities: Intact distal pulses, right lower extremity movement limited by pain, + lower extremity edema Skin: Incision site bloody Psychiatry: Unable to assess due to medical condition    Data Reviewed: I have personally reviewed following labs and imaging studies  CBC: Recent Labs  Lab 12/10/17 0019 12/11/17 0409 12/12/17 0546  WBC 12.2* 11.4* 9.7  HGB 10.2* 8.7* 7.9*  HCT 30.1* 23.2* 23.9*  MCV 90.1 92.1 92.6  PLT 300 262 256   Basic Metabolic Panel: Recent Labs  Lab 12/10/17 0019 12/11/17 0409 12/12/17 0546  NA 137 139 141  K 3.0* 3.7 3.0*  CL 105 107 108  CO2 23 22 25   GLUCOSE 165* 160* 102*  BUN 12 13 16   CREATININE 0.93 0.81 0.78  CALCIUM 8.6* 8.4* 8.0*  MG 1.6* 2.1  --    GFR: Estimated Creatinine Clearance: 67.2 mL/min (by C-G formula based on SCr of 0.78 mg/dL). Liver Function Tests: No results for input(s): AST, ALT, ALKPHOS, BILITOT, PROT, ALBUMIN in the last 168 hours. No results for input(s): LIPASE, AMYLASE in the last 168 hours. No results  for input(s): AMMONIA in the last 168 hours. Coagulation Profile: No results for input(s): INR, PROTIME in the last 168 hours. Cardiac Enzymes: No results for input(s): CKTOTAL, CKMB, CKMBINDEX, TROPONINI in the last 168 hours. BNP (last 3 results) No results for input(s): PROBNP in the last 8760 hours. HbA1C: No results for input(s): HGBA1C in the last 72 hours. CBG: No results for input(s): GLUCAP in the  last 168 hours. Lipid Profile: Recent Labs    12/10/17 0019  CHOL 118  HDL 37*  LDLCALC 70  TRIG 57  CHOLHDL 3.2   Thyroid Function Tests: Recent Labs    12/10/17 0019  TSH 3.404   Anemia Panel: No results for input(s): VITAMINB12, FOLATE, FERRITIN, TIBC, IRON, RETICCTPCT in the last 72 hours. Sepsis Labs: No results for input(s): PROCALCITON, LATICACIDVEN in the last 168 hours.  Recent Results (from the past 240 hour(s))  Surgical pcr screen     Status: None   Collection Time: 12/03/17 10:38 AM  Result Value Ref Range Status   MRSA, PCR NEGATIVE NEGATIVE Final   Staphylococcus aureus NEGATIVE NEGATIVE Final    Comment: (NOTE) The Xpert SA Assay (FDA approved for NASAL specimens in patients 5 years of age and older), is one component of a comprehensive surveillance program. It is not intended to diagnose infection nor to guide or monitor treatment. Performed at [facility], [address] [place], [place] [address]          Radiology Studies: No results found.      Scheduled Meds: . amLODipine  5 mg Oral Daily  . aspirin EC  325 mg Oral BID PC  . atorvastatin  10 mg Oral Daily  . celecoxib  200 mg Oral Daily  . docusate sodium  100 mg Oral BID  . ferrous sulfate  325 mg Oral Q lunch  . furosemide  40 mg Oral Daily  . Influenza vac split quadrivalent PF  0.5 mL Intramuscular Tomorrow-1000  . levothyroxine  125 mcg Oral QAC breakfast  . Melatonin  1 tablet Oral QHS  . multivitamin  1 tablet Oral Daily  . [START ON 12/13/2017] potassium chloride  20 mEq Oral Daily   Continuous Infusions: . methocarbamol (ROBAXIN) IV       LOS: 3 days    Time spent: 35    Delaine Lame, MD [facility]   If 7PM-7AM, please contact night-coverage [email or web address] Password [email or web address] 12/12/2017, 12:41 PM

## 2017-12-16 NOTE — Anesthesia Postprocedure Evaluation (Signed)
Anesthesia Post Note  Patient: Terri Vasquez  Procedure(s) Performed: RIGHT TOTAL HIP ARTHROPLASTY ANTERIOR APPROACH (Right Hip) HARDWARE REMOVAL (Right Hip)     Patient location during evaluation: PACU Anesthesia Type: Spinal Level of consciousness: sedated Pain management: pain level controlled Vital Signs Assessment: post-procedure vital signs reviewed and stable Respiratory status: spontaneous breathing Cardiovascular status: stable Postop Assessment: spinal receding, no headache, no backache and patient able to bend at knees Anesthetic complications: no    Last Vitals:  Vitals:   12/12/17 0833 12/12/17 1400  BP: 129/65 114/63  Pulse:    Resp:  17  Temp:  36.7 C  SpO2:  97%    Last Pain:  Vitals:   12/12/17 1400  TempSrc: Oral  PainSc:    Pain Goal:                 Malakhai Beitler JR,JOHN Hawkin Charo

## 2018-01-06 ENCOUNTER — Encounter

## 2018-01-06 ENCOUNTER — Ambulatory Visit: Payer: Medicare Other | Admitting: Neurology

## 2018-01-23 ENCOUNTER — Encounter: Payer: Self-pay | Admitting: Internal Medicine

## 2018-01-23 ENCOUNTER — Non-Acute Institutional Stay: Payer: Medicare Other | Admitting: Internal Medicine

## 2018-01-23 VITALS — BP 110/70 | HR 84 | Resp 20 | Wt 175.8 lb

## 2018-01-23 DIAGNOSIS — F0390 Unspecified dementia without behavioral disturbance: Secondary | ICD-10-CM

## 2018-01-23 NOTE — Progress Notes (Signed)
[facility] Telephone: [phone] Fax: [phone]  PATIENT NAME: Terri Vasquez DOB: [birth year] MRN: [id number]  PRIMARY CARE PROVIDER:   Renford Dills, MD  REFERRING PROVIDER:  Renford Dills, MD [address] [place], [place] [address]  RESPONSIBLE PARTY:   Shastina Rua (son) [phone]. Minsa Weddington (son) [phone], [phone]  INTERVAL HISTORY:  Terri Vasquez is a 77 y.o. year old female with h/o dementia, HTN, and hypothyroidism. She recently underwent an elective THR for revision of her right hip. This is a routine f/u palliative care visit.    RECOMMENDATIONS and PLAN:   1. Dementia without behavioral disturbance. Conversational though forgetful. Ambulating about with good technique with walker s/p her hip repair, and is independent in ADLs. Her weight is 175.8lbs, from 181lbs early October.  2. Advanced Care Directives: DNR on chart  I spent 15 minutes providing this consultation,  from 11:30am to 11:45am. More than 50% of the time in this consultation was spent coordinating communication.   CODE STATUS: DNR  PPS: weak 60%  HOSPICE ELIGIBILITY/DIAGNOSIS: No; prognosis thought greater than 6 months.  PAST MEDICAL HISTORY:  Past Medical History:  Diagnosis Date  . Anxiety   . Essential hypertension 08/24/2017  . Family history of adverse reaction to anesthesia    sons have difficulty waking up  . Hypertension   . Hypothyroid 08/24/2017  . Hypothyroidism   . Thyroid disease   . Varicose veins     SOCIAL HX:  Social History   Tobacco Use  . Smoking status: Former Smoker    Types: Cigarettes    Last attempt to quit: 07/14/1996    Years since quitting: 21.5  . Smokeless tobacco: Never Used  Substance Use Topics  . Alcohol use: No    ALLERGIES: No Known Allergies   PERTINENT MEDICATIONS:  Outpatient Encounter Medications as of 01/23/2018  Medication Sig  . acetaminophen (TYLENOL) 325 MG tablet Take 1-2 tablets  (325-650 mg total) by mouth every 6 (six) hours as needed for mild pain (pain score 1-3 or temp > 100.5).  Marland Kitchen amLODipine (NORVASC) 5 MG tablet Take 5 mg by mouth daily.  Marland Kitchen atorvastatin (LIPITOR) 10 MG tablet Take 10 mg by mouth daily.   . Calcium Carb-Cholecalciferol (CALCIUM 500/D) 500-400 MG-UNIT CHEW Chew 1 tablet by mouth daily.  . ferrous sulfate 325 (65 FE) MG EC tablet Take 325 mg by mouth 2 (two) times daily.  . furosemide (LASIX) 20 MG tablet Take 20 mg by mouth daily. Once daily at 2pm  . furosemide (LASIX) 40 MG tablet Take 40 mg by mouth daily.  Marland Kitchen levothyroxine (SYNTHROID, LEVOTHROID) 150 MCG tablet Take 125 mcg by mouth daily before breakfast.   . Melatonin 3 MG TABS Take 1 tablet by mouth at bedtime.   . Multiple Vitamins-Minerals (ICAPS AREDS 2 PO) Take 2 tablets by mouth daily.  . potassium chloride SA (K-DUR,KLOR-CON) 20 MEQ tablet Take 1 tablet (20 mEq total) by mouth daily.  . [DISCONTINUED] potassium chloride SA (K-DUR,KLOR-CON) 20 MEQ tablet Take 10 mEq by mouth daily.   . [DISCONTINUED] aspirin EC 325 MG EC tablet Take 1 tablet (325 mg total) by mouth 2 (two) times daily.  . [DISCONTINUED] aspirin EC 325 MG EC tablet Take 1 tablet (325 mg total) by mouth 2 (two) times daily after a meal.  . [DISCONTINUED] docusate sodium (COLACE) 100 MG capsule Take 1 capsule (100 mg total) by mouth 2 (two) times daily.  . [DISCONTINUED]  polyethylene glycol (MIRALAX / GLYCOLAX) packet Take 17 g by mouth daily as needed for mild constipation.  . [DISCONTINUED] tiZANidine (ZANAFLEX) 2 MG tablet Take 1 mg by mouth 3 (three) times daily.   No facility-administered encounter medications on file as of 01/23/2018.     PHYSICAL EXAM:  VS: BP 110/70, HR 84. RR 20 General: Well nourished NAD. Alert and pleasantly conversant. Sitting up in recliner. Cardiovascular: regular rate and rhythm Pulmonary: clear ant fields Abdomen: soft, nontender, + bowel sounds Extremities: bilateral LE edema, right  with small amount of weeping  edema, no joint deformities Skin: no rashes Neurological: Weakness but otherwise nonfocal  Anselm LisMary P Serpe, NP

## 2018-01-24 ENCOUNTER — Telehealth: Payer: Self-pay | Admitting: Internal Medicine

## 2018-01-24 NOTE — Telephone Encounter (Signed)
TC to Terri Vasquez (son) [phone] with updates. He expressed no particular questions or concerns.   Holly BodilyMary Arshia Rondon NP-C

## 2018-02-24 ENCOUNTER — Non-Acute Institutional Stay: Payer: Medicare Other | Admitting: Internal Medicine

## 2018-02-24 VITALS — BP 112/60 | HR 72 | Resp 12 | Wt 178.4 lb

## 2018-02-24 DIAGNOSIS — Z515 Encounter for palliative care: Secondary | ICD-10-CM

## 2018-02-24 NOTE — Progress Notes (Addendum)
[facility] Telephone: [phone] Fax: [phone]  PATIENT NAME: Terri Vasquez DOB: 07-10-1940 MRN: [id number]  PRIMARY CARE PROVIDER:   Renford DillsPolite, Ronald, MD  REFERRING PROVIDER:  Renford DillsPolite, Ronald, MD [address] [place], [place] [address]  RESPONSIBLE PARTY:   Terri Vasquez (son) [phone]. Terri Vasquez (son) [phone](H) [phone], [phone]) [phone]  INTERVAL HISTORY:  Terri Mallickorma B Webbis a 77 y.o.femalewith h/o dementia, HTN, and hypothyroidism. Two months earlier, she had an elective THR revision of her right hip. This is a routine f/u palliative care visit, from 01/23/2018.    RECOMMENDATIONS and PLAN:   1. Dementia without behavioral disturbance.Staff report no significant changes over this last month. She is pleasantly conversant though forgetful. She ambulats about with good technique with walker3, and is independent in ADLs. Her weight is 178.4lbs, up 3lbs over the last month, but down 7 lbs over last 2 months. Patient's amlodipine was discontinued. [facility] is applying compression wraps to BLE for edema. Terri LevanAlison Ryniewicz NP following for psych; no issues or concerns. Patient's Zoloft has been discontinued -Consider decrease of tizanidine 25mg  to bid, now that patient is a few months out from her hip revision. Taper to discontinue if possible.  2. Advanced Care Directives: DNR on chart.  3. F/U NP visit in 1-2 months.  I spent 15 minutes providing this consultation,  from 2:30pm to 2:45pm. More than 50% of the time in this consultation was spent coordinating communication.   CODE STATUS: DNR  PPS: weak 60%  HOSPICE ELIGIBILITY/DIAGNOSIS: No; prognosis thought greater than 6 months.  PAST MEDICAL HISTORY:  Past Medical History:  Diagnosis Date  . Anxiety   . Essential hypertension 08/24/2017  . Family history of adverse reaction to anesthesia    sons have difficulty waking up  . Hypertension   . Hypothyroid 08/24/2017  .  Hypothyroidism   . Thyroid disease   . Varicose veins     SOCIAL HX:  Social History   Tobacco Use  . Smoking status: Former Smoker    Types: Cigarettes    Last attempt to quit: 07/14/1996    Years since quitting: 21.6  . Smokeless tobacco: Never Used  Substance Use Topics  . Alcohol use: No    ALLERGIES: No Known Allergies   PERTINENT MEDICATIONS:  Outpatient Encounter Medications as of 02/24/2018  Medication Sig  . acetaminophen (TYLENOL) 325 MG tablet Take 1-2 tablets (325-650 mg total) by mouth every 6 (six) hours as needed for mild pain (pain score 1-3 or temp > 100.5).  Marland Kitchen. atorvastatin (LIPITOR) 10 MG tablet Take 10 mg by mouth daily.   . Calcium Carb-Cholecalciferol (CALCIUM 500/D) 500-400 MG-UNIT CHEW Chew 1 tablet by mouth daily.  . ferrous sulfate 325 (65 FE) MG EC tablet Take 325 mg by mouth 2 (two) times daily.  . furosemide (LASIX) 20 MG tablet Take 20 mg by mouth daily. Once daily at 2pm  . furosemide (LASIX) 40 MG tablet Take 40 mg by mouth daily.  Marland Kitchen. levothyroxine (SYNTHROID, LEVOTHROID) 150 MCG tablet Take 150 mcg by mouth daily before breakfast.   . Melatonin 3 MG TABS Take 1 tablet by mouth at bedtime.   . Multiple Vitamins-Minerals (ICAPS AREDS 2 PO) Take 2 tablets by mouth daily.  . potassium chloride SA (K-DUR,KLOR-CON) 20 MEQ tablet Take 1 tablet (20 mEq total) by mouth daily.  Marland Kitchen. tiZANidine (ZANAFLEX) 2 MG tablet Take by mouth 3 (three) times daily.  . [DISCONTINUED] amLODipine (NORVASC) 5 MG  tablet Take 5 mg by mouth daily.   No facility-administered encounter medications on file as of 02/24/2018.     PHYSICAL EXAM:   General: NAD, frail appearing, thin Cardiovascular: regular rate and rhythm Pulmonary: clear ant fields Abdomen: soft, nontender, + bowel sounds GU: no suprapubic tenderness Extremities: no edema, no joint deformities Skin: no rashes Neurological: Weakness but otherwise nonfocal  Anselm LisMary P Serpe, NP

## 2018-02-27 ENCOUNTER — Encounter: Payer: Self-pay | Admitting: Internal Medicine

## 2018-03-18 IMAGING — MR MR HEAD W/O CM
10 series · 48 of 48 positions shown · non-contrast
Comparison: 09/22/2016 CT head

CLINICAL DATA: 76 y/o  F; three months of increased memory loss.

EXAM:
MRI HEAD WITHOUT CONTRAST
TECHNIQUE: Multiplanar, multiecho pulse sequences of the brain and surrounding
structures were obtained without intravenous contrast.

[Series 2: T1 · sagittal · 5.0mm · 0.45mm/px · 2 of 21 slices shown]
[im 1/21]
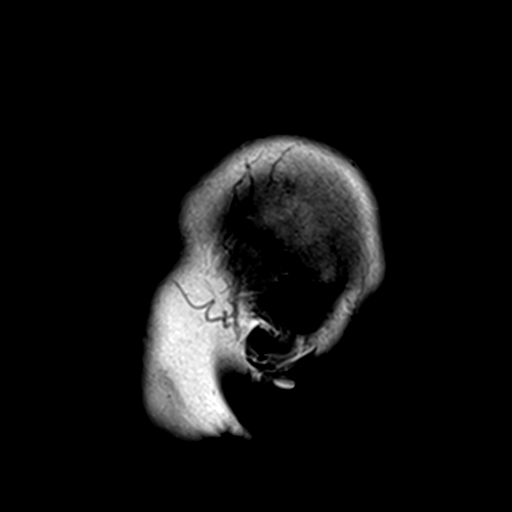
[im 21/21]
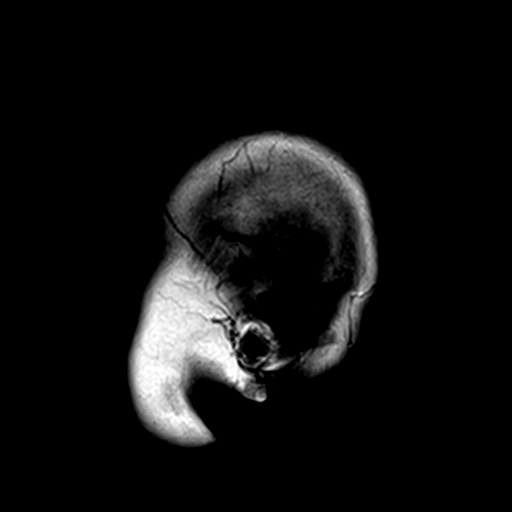

[Series 3: T2 · axial · 5.0mm · 0.60mm/px · z∈[-38,+98]mm · 2 of 22 slices shown (1 of 2)]
[im 1/22]
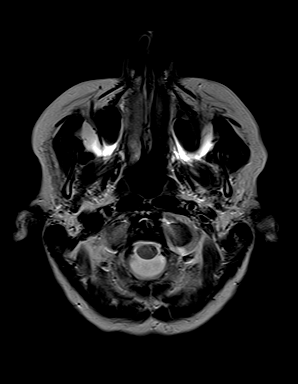
[im 22/22]
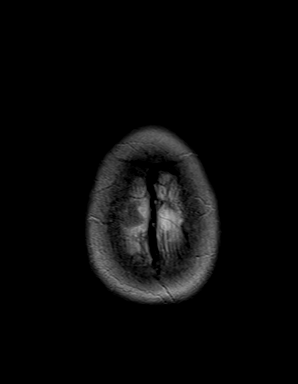

[Series 4: DWI · axial · 3.0mm · 1.80mm/px · z∈[-37,+98]mm · 9 of 89 slices shown (1 of 4)]
[im 1/89]
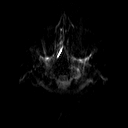
[im 12/89]
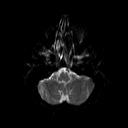
[im 23/89]
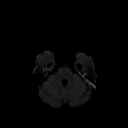
[im 34/89]
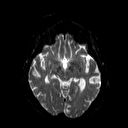
[im 45/89]
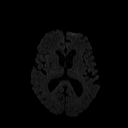
[im 56/89]
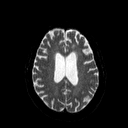
[im 67/89]
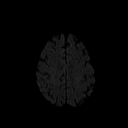
[im 78/89]
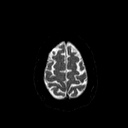
[im 89/89]
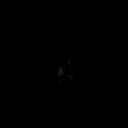

[Series 5: DWI · axial · 3.0mm · 1.80mm/px · z∈[-37,+98]mm · 4 of 46 slices shown (2 of 4)]
[im 1/46]
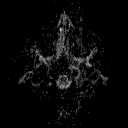
[im 16/46]
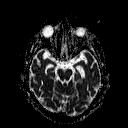
[im 31/46]
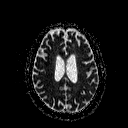
[im 46/46]
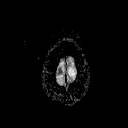

[Series 6: DWI · coronal · 5.0mm · 1.80mm/px · 6 of 62 slices shown (3 of 4)]
[im 1/62]
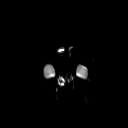
[im 13/62]
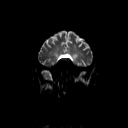
[im 25/62]
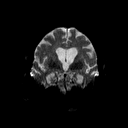
[im 37/62]
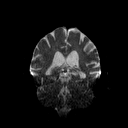
[im 49/62]
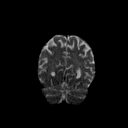
[im 62/62]
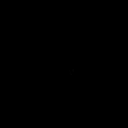

[Series 7: DWI · coronal · 5.0mm · 1.80mm/px · 3 of 32 slices shown (4 of 4)]
[im 1/32]
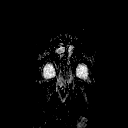
[im 16/32]
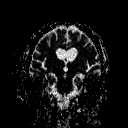
[im 32/32]
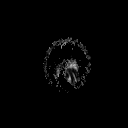

[Series 8: FLAIR · axial · 3.0mm · 0.45mm/px · z∈[-40,+100]mm · 3 of 30 slices shown]
[im 1/30]
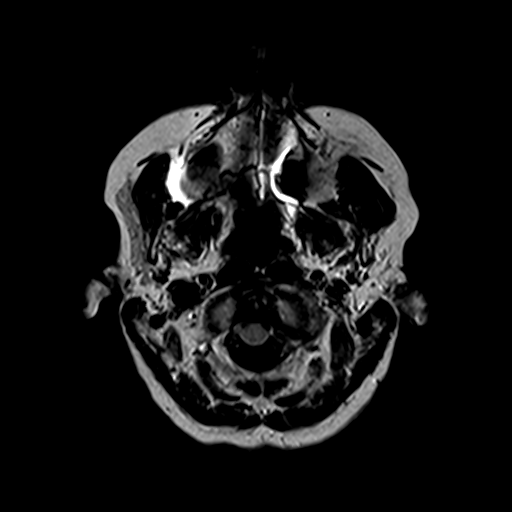
[im 15/30]
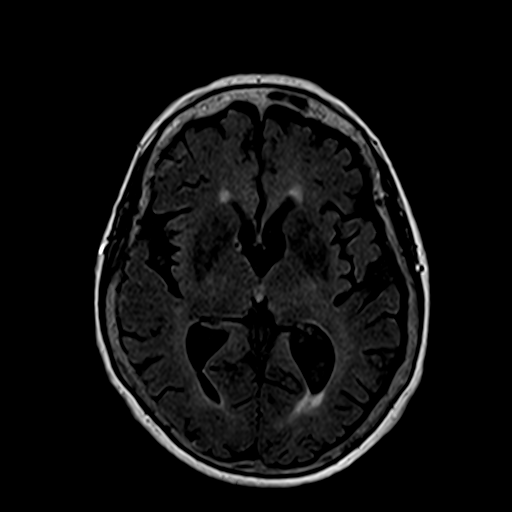
[im 30/30]
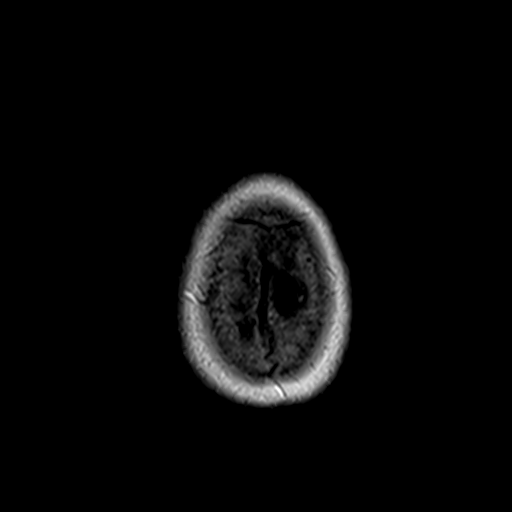

[Series 10: swi_images · axial · 5.0mm · 0.90mm/px · z∈[-42,+102]mm · 3 of 30 slices shown]
[im 1/30]
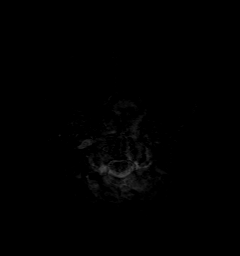
[im 15/30]
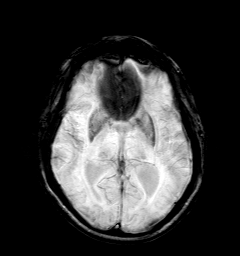
[im 30/30]
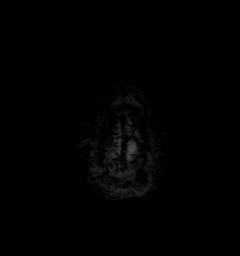

[Series 11: t1_mpr_tra · axial · 1.0mm · 0.72mm/px · z∈[-42,+101]mm · 14 of 144 slices shown]
[im 1/144]
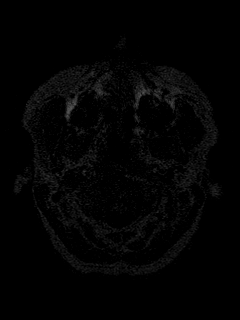
[im 12/144]
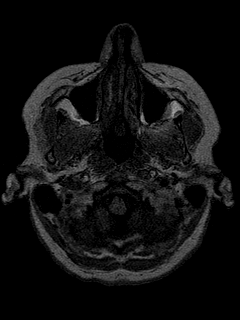
[im 23/144]
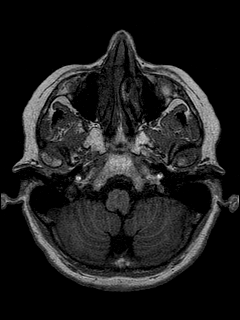
[im 34/144]
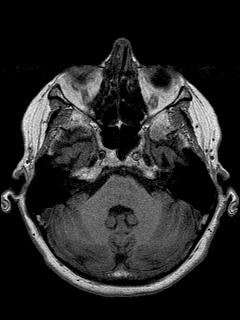
[im 45/144]
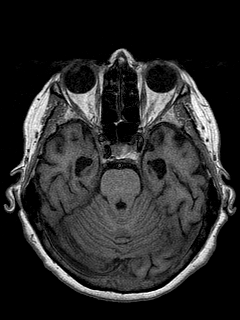
[im 56/144]
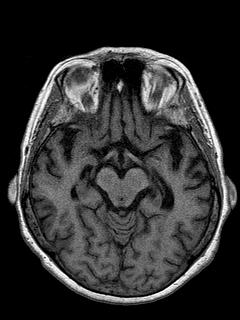
[im 67/144]
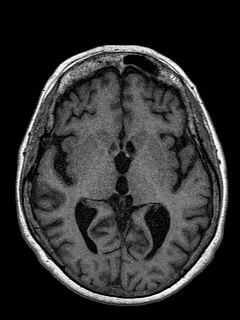
[im 78/144]
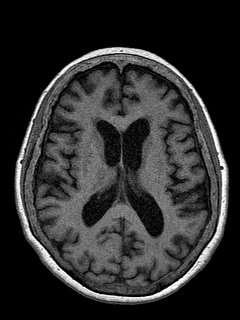
[im 89/144]
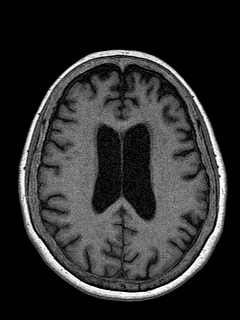
[im 100/144]
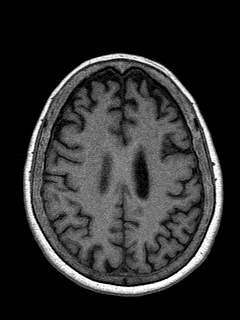
[im 111/144]
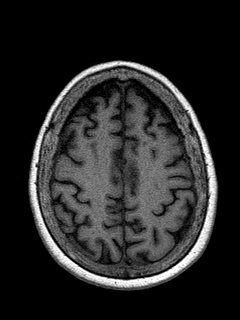
[im 122/144]
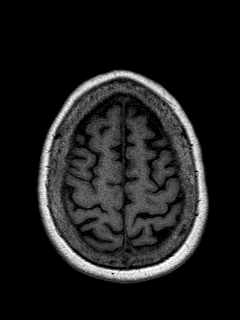
[im 133/144]
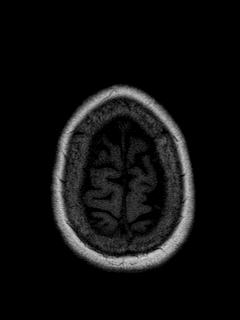
[im 144/144]
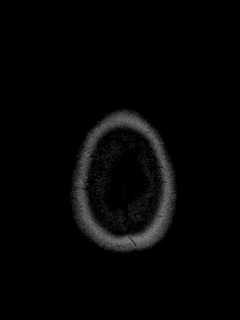

[Series 12: T2 · coronal · 5.0mm · 0.45mm/px · 2 of 25 slices shown (2 of 2)]
[im 1/25]
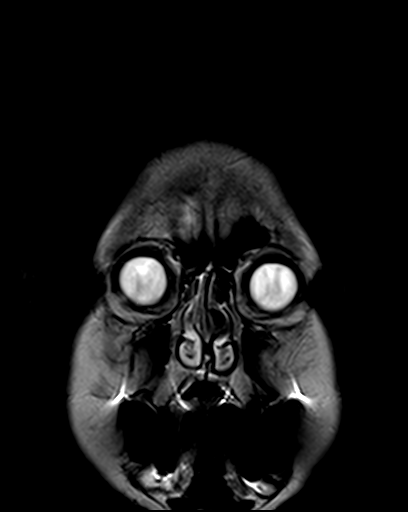
[im 25/25]
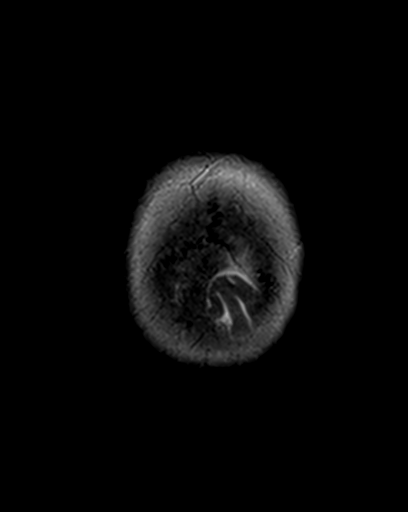

[48 of 48 positions shown; findings below may reference images not displayed]

FINDINGS: Brain: No acute infarction, hemorrhage, hydrocephalus, extra-axial
collection or mass lesion. Few nonspecific foci of T2 FLAIR
hyperintense signal abnormality in subcortical and periventricular
white matter is compatible with mild chronic microvascular ischemic
changes for age. Mild-to-moderate brain parenchymal volume loss.

Vascular: Normal flow voids.

Skull and upper cervical spine: Normal marrow signal.

Sinuses/Orbits: Negative.

Other: None.
IMPRESSION: 1. No acute intracranial abnormality identified.
2. Mild chronic microvascular ischemic changes and mild to moderate
parenchymal volume loss of the brain.

By: Morati Saputo M.D.

## 2018-09-08 ENCOUNTER — Encounter: Payer: Self-pay | Admitting: Internal Medicine

## 2018-09-08 ENCOUNTER — Non-Acute Institutional Stay: Payer: Medicare Other | Admitting: Internal Medicine

## 2018-09-08 ENCOUNTER — Other Ambulatory Visit: Payer: Self-pay

## 2018-09-08 DIAGNOSIS — Z515 Encounter for palliative care: Secondary | ICD-10-CM

## 2018-09-08 NOTE — Progress Notes (Addendum)
July 6th, 2020 [facility] Consult Note Telephone: [phone]  Fax: [phone]   PATIENT NAME: Terri Vasquez DOB: [birth year] MRN: [id number]   PRIMARY CARE PROVIDER:   Renford DillsPolite, Ronald, MD   REFERRING PROVIDER:  Renford DillsPolite, Ronald, MD [address] [place], [place] [address]   RESPONSIBLE PARTY:   Adine Maduraravis Eynon (son) [phone]. Maurene CapesDarren Brickey (son) [phone]) [phone], [phone](M) [phone]     RECOMMENDATIONS and PLAN:    1.Dementia without behavioral disturbance:  Staff report slight increase in confusion over these last few weeks. She was asking where her husband was, thinking he has gone "upstairs" when he was indeed still in the room. She is pleasantly conversant though forgetful. She ambulates about with good technique with walker and is independent in ADLs. She had one recent fall; found on bathroom floor without injuries. She was unable to tell me the specifics of that event. No injuries. Memory Care Coordinator is arranging for a UA C&S to be sent (h/o UTIs) to see if she had an occult infection contributing to her confusion. She appears well nourished; has a good appetite. Her LE edema noted last visit has resolved. Gordy LevanAlison Ryniewicz NP was following for psych; no issues or concerns.   2. Family Supports: Remains contentedly living in her apartment with her husband Rosanne AshingJim. I called her son Feliz Beamravis with updates.   3. Advanced Care Directives: DNR on chart.   4. F/U NP visit in 1-2 months. I need to upload her DNR onto the [facility] EMR.   I spent 30 minutes providing this consultation from 4 pm to 4:30 pm. More than 50% of the time in this consultation was spent coordinating communication.    CODE STATUS: DNR   PPS: weak 60%   HOSPICE ELIGIBILITY/DIAGNOSIS: No; prognosis thought greater than 6 months.   PAST MEDICAL HISTORY: Terri Vasquez is a 78 y.o. female with h/o dementia, HTN, hypothyroidism, and an elective THR revision of her  right hip (2019). This is a routine f/u palliative care visit from 02/24/2018.    Past Medical History:  Diagnosis Date  . Anxiety   . Essential hypertension 08/24/2017  . Family history of adverse reaction to anesthesia    sons have difficulty waking up  . Hypertension   . Hypothyroid 08/24/2017  . Hypothyroidism   . Thyroid disease   . Varicose veins     SOCIAL HX:  Social History   Tobacco Use  . Smoking status: Former Smoker    Types: Cigarettes    Quit date: 07/14/1996    Years since quitting: 22.1  . Smokeless tobacco: Never Used  Substance Use Topics  . Alcohol use: No    ALLERGIES: No Known Allergies   PERTINENT MEDICATIONS:  Outpatient Encounter Medications as of 09/08/2018  Medication Sig  . acetaminophen (TYLENOL) 325 MG tablet Take 1-2 tablets (325-650 mg total) by mouth every 6 (six) hours as needed for mild pain (pain score 1-3 or temp > 100.5).  Marland Kitchen. atorvastatin (LIPITOR) 10 MG tablet Take 10 mg by mouth daily.   . Calcium Carb-Cholecalciferol (CALCIUM 500/D) 500-400 MG-UNIT CHEW Chew 1 tablet by mouth daily.  . ferrous sulfate 325 (65 FE) MG EC tablet Take 325 mg by mouth 2 (two) times daily.  . furosemide (LASIX) 20 MG tablet Take 20 mg by mouth daily. Once daily at 2pm  . furosemide (LASIX) 40 MG tablet Take 40 mg by mouth daily.  Marland Kitchen. levothyroxine (SYNTHROID,  LEVOTHROID) 150 MCG tablet Take 150 mcg by mouth daily before breakfast.   . Melatonin 3 MG TABS Take 1 tablet by mouth at bedtime.   . Multiple Vitamins-Minerals (ICAPS AREDS 2 PO) Take 2 tablets by mouth daily.  . potassium chloride SA (K-DUR,KLOR-CON) 20 MEQ tablet Take 1 tablet (20 mEq total) by mouth daily.  Marland Kitchen tiZANidine (ZANAFLEX) 2 MG tablet Take by mouth 3 (three) times daily.   No facility-administered encounter medications on file as of 09/08/2018.     PHYSICAL EXAM:   PHYSICAL EXAM:    BP: 98/64, HR 64, RR 20  General: Well nourished, pleasantly conversant. Sitting up in the recliner.  Husband Clair Gulling in attendance. Cardiovascular: regular rate and rhythm Pulmonary: clear ant / posterior fields Abdomen: soft, nontender, + bowel sounds GU: no suprapubic tenderness Extremities: no edema, no joint deformities Skin: no rashes Neurological: Weakness but otherwise non-focal   Julianne Handler, NP

## 2019-01-27 ENCOUNTER — Telehealth: Payer: Self-pay

## 2019-01-27 NOTE — Telephone Encounter (Addendum)
Care Conference today with [facility]. Report received that patient has displayed a functional and cognitive decline. Patient has required more assistance with ADL's. She has had a recent fall without injury. Patient has demonstated cognitive decline as she is having more difficulty finding words and expressing her needs. Will update Palliative NP

## 2019-02-25 ENCOUNTER — Non-Acute Institutional Stay: Payer: Medicare Other | Admitting: Internal Medicine

## 2019-02-25 DIAGNOSIS — Z515 Encounter for palliative care: Secondary | ICD-10-CM

## 2019-02-26 ENCOUNTER — Other Ambulatory Visit: Payer: Self-pay

## 2019-03-02 ENCOUNTER — Non-Acute Institutional Stay: Payer: Medicare Other | Admitting: Internal Medicine

## 2019-03-02 ENCOUNTER — Encounter: Payer: Self-pay | Admitting: Internal Medicine

## 2019-03-02 DIAGNOSIS — Z515 Encounter for palliative care: Secondary | ICD-10-CM

## 2019-03-02 NOTE — Progress Notes (Signed)
Dec 28th, 2020 [facility] Palliative Care Consult Note Telephone: [phone]  Fax: [phone]   PATIENT NAME: Terri Vasquez DOB: [birth year] MRN: [id number]   PRIMARY CARE PROVIDER:   Renford Dills, MD   REFERRING PROVIDER:  Renford Dills, MD [address] [place], [place] [address]   RESPONSIBLE PARTY:   Ananda Caya (son) [phone]. Terri Vasquez (son) [phone]     RECOMMENDATIONS and PLAN:  1. Advance Care Planning:   A. Directives: DNR on chart. B. Goals of Care: Reviewed with [name]; to keep his mom safe and as happy as is possible.  2. Dementia without behavioral disturbance: Progression in cognitive decline over these last 5 months, markedly so over the last 2 weeks in the setting of UTI, COVID infection, and most recently shingles (affecting left forehead and L eye. UTI treated with Macrobid (completed course). Continues COVID treatment with Melatonin, steroids, Vitamin D3, and Eliquis. On Acyclovir for shingles. Patient is currently speaking in a word salad. She is oriented to self only and having hallucinations (seeing strange men in her room) that are not bothersome or frightening to her. She is dependent for hygiene, dressing, and toileting. She is incontinent. She is without respiratory compromise, with normal RR and saturations on RA. Last week she developed increased difficulty with ambulation and was dragging her L foot/leg and turning it inward. Due to increased risk of falls, staff have kept her non-ambulatory. She is a heavy 1-2 person assist to transfer. Over these last few weeks, her oral intake has diminished to skipping meals, to less than 25%. Staff report she is drinking adequate fluids. No recent weights d/t COVID restrictions. Patient is currently in a room separated from her husband, as his dementia with behavioral disturbances has progressed so that he posed a danger to his wife, sometimes  striking out at her.     3. Family Supports: Very supportive children. I called [name] with visit updates. Terri Vasquez recognizes the decline in his mom, with associated poor prognosis.    4. F/U NP visit in 1-2 months. Follow for hospice eligibility.    I spent 30 minutes providing this consultation from 4 pm to 4:30 pm. More than 50% of the time in this consultation was spent coordinating communication.    CODE STATUS: DNR   PPS: 30% (form a weak 60% 5 months earlier)   HOSPICE ELIGIBILITY/DIAGNOSIS: To be determined   PAST MEDICAL HISTORY: Terri Vasquez is a 78 y.o. female with h/o dementia, HTN, hypothyroidism, and an elective THR revision of her right hip (2019). This is a routine f/u palliative care visit from 09/08/2018. Recently tested COVID-19 positive.  PAST MEDICAL HISTORY:  Past Medical History:  Diagnosis Date  . Anxiety   . Essential hypertension 08/24/2017  . Family history of adverse reaction to anesthesia    sons have difficulty waking up  . Hypertension   . Hypothyroid 08/24/2017  . Hypothyroidism   . Thyroid disease   . Varicose veins     SOCIAL HX:  Social History   Tobacco Use  . Smoking status: Former Smoker    Types: Cigarettes    Quit date: 07/14/1996    Years since quitting: 22.6  . Smokeless tobacco: Never Used  Substance Use Topics  . Alcohol use: No    ALLERGIES: No Known Allergies   PERTINENT MEDICATIONS:  Outpatient Encounter Medications as of 03/02/2019  Medication Sig  . acetaminophen (TYLENOL) 325  MG tablet Take 1-2 tablets (325-650 mg total) by mouth every 6 (six) hours as needed for mild pain (pain score 1-3 or temp > 100.5).  Marland Kitchen atorvastatin (LIPITOR) 10 MG tablet Take 10 mg by mouth daily.   . Calcium Carb-Cholecalciferol (CALCIUM 500/D) 500-400 MG-UNIT CHEW Chew 1 tablet by mouth daily.  . ferrous sulfate 325 (65 FE) MG EC tablet Take 325 mg by mouth 2 (two) times daily.  . furosemide (LASIX) 20 MG tablet Take 20 mg by mouth daily. Once  daily at 2pm  . furosemide (LASIX) 40 MG tablet Take 40 mg by mouth daily.  Marland Kitchen levothyroxine (SYNTHROID, LEVOTHROID) 150 MCG tablet Take 150 mcg by mouth daily before breakfast.   . Melatonin 3 MG TABS Take 1 tablet by mouth at bedtime.   . Multiple Vitamins-Minerals (ICAPS AREDS 2 PO) Take 2 tablets by mouth daily.  . potassium chloride SA (K-DUR,KLOR-CON) 20 MEQ tablet Take 1 tablet (20 mEq total) by mouth daily.  Marland Kitchen tiZANidine (ZANAFLEX) 2 MG tablet Take by mouth 3 (three) times daily.   No facility-administered encounter medications on file as of 03/02/2019.    PHYSICAL EXAM:   98/88 (at her baseline), RR 12, H R64, 96% Sats on RA   General: Slender, word salad. Asking about "man in room". Sitting up in the recliner. Able to tell me her name and shake my offered hand Cardiovascular: regular rate and rhythm, soft syst murmur RUSB Pulmonary: clear ant / posterior fields Abdomen: soft, nontender, + bowel sounds GU: no suprapubic tenderness Extremities: no edema, no joint deformities Skin: shingles involving Left forehead and L eye, just below L eye Neurological: Weakness but otherwise non-focal    Julianne Handler, NP

## 2019-03-03 ENCOUNTER — Other Ambulatory Visit: Payer: Self-pay

## 2019-03-04 NOTE — Progress Notes (Signed)
Appointment scheduled in error.  Patient not seen; no charge. Jaylin Roundy NP-C 

## 2020-12-03 DEATH — deceased
